# Patient Record
Sex: Female | Born: 1955 | Race: White | Hispanic: No | Smoking: Never smoker
Health system: Southern US, Community
[De-identification: ages and names within clinical notes are randomized; demographics above are authoritative.]

## PROBLEM LIST (undated history)

## (undated) DIAGNOSIS — E7800 Pure hypercholesterolemia, unspecified: Secondary | ICD-10-CM

## (undated) DIAGNOSIS — E041 Nontoxic single thyroid nodule: Secondary | ICD-10-CM

## (undated) DIAGNOSIS — E559 Vitamin D deficiency, unspecified: Secondary | ICD-10-CM

## (undated) DIAGNOSIS — R300 Dysuria: Secondary | ICD-10-CM

## (undated) DIAGNOSIS — R5383 Other fatigue: Secondary | ICD-10-CM

## (undated) HISTORY — DX: Dysuria: R30.0

## (undated) HISTORY — DX: Nontoxic single thyroid nodule: E04.1

## (undated) HISTORY — PX: REPLACEMENT TOTAL KNEE: SUR1224

## (undated) HISTORY — DX: Pure hypercholesterolemia, unspecified: E78.00

## (undated) HISTORY — PX: MEDIAL PARTIAL KNEE REPLACEMENT: SHX5965

## (undated) HISTORY — DX: Vitamin D deficiency, unspecified: E55.9

## (undated) HISTORY — DX: Other fatigue: R53.83

---

## 2012-04-09 DIAGNOSIS — F32A Depression, unspecified: Secondary | ICD-10-CM | POA: Insufficient documentation

## 2012-12-11 DIAGNOSIS — R0789 Other chest pain: Secondary | ICD-10-CM | POA: Insufficient documentation

## 2012-12-11 DIAGNOSIS — R0602 Shortness of breath: Secondary | ICD-10-CM | POA: Insufficient documentation

## 2012-12-27 DIAGNOSIS — M179 Osteoarthritis of knee, unspecified: Secondary | ICD-10-CM | POA: Insufficient documentation

## 2013-03-13 DIAGNOSIS — Z96659 Presence of unspecified artificial knee joint: Secondary | ICD-10-CM | POA: Insufficient documentation

## 2013-03-26 DIAGNOSIS — R29898 Other symptoms and signs involving the musculoskeletal system: Secondary | ICD-10-CM | POA: Insufficient documentation

## 2013-03-26 DIAGNOSIS — M25669 Stiffness of unspecified knee, not elsewhere classified: Secondary | ICD-10-CM | POA: Insufficient documentation

## 2013-03-26 DIAGNOSIS — R269 Unspecified abnormalities of gait and mobility: Secondary | ICD-10-CM | POA: Insufficient documentation

## 2013-04-03 DIAGNOSIS — M25561 Pain in right knee: Secondary | ICD-10-CM | POA: Insufficient documentation

## 2013-06-06 DIAGNOSIS — M1710 Unilateral primary osteoarthritis, unspecified knee: Secondary | ICD-10-CM | POA: Insufficient documentation

## 2020-03-07 LAB — TSH: TSH: 0.51 (ref 0.41–5.90)

## 2020-03-07 LAB — BASIC METABOLIC PANEL
BUN: 17 (ref 4–21)
Creatinine: 0.8 (ref 0.5–1.1)

## 2020-03-07 LAB — COMPREHENSIVE METABOLIC PANEL
GFR calc Af Amer: 93
GFR calc non Af Amer: 76

## 2020-03-07 LAB — HEMOGLOBIN A1C: Hemoglobin A1C: 5.5

## 2020-03-07 LAB — VITAMIN D 25 HYDROXY (VIT D DEFICIENCY, FRACTURES): Vit D, 25-Hydroxy: 50.66

## 2020-05-05 ENCOUNTER — Other Ambulatory Visit: Payer: Self-pay

## 2020-05-05 ENCOUNTER — Encounter: Payer: Self-pay | Admitting: "Endocrinology

## 2020-05-05 ENCOUNTER — Ambulatory Visit (INDEPENDENT_AMBULATORY_CARE_PROVIDER_SITE_OTHER): Payer: Medicare Other | Admitting: "Endocrinology

## 2020-05-05 VITALS — BP 140/78 | HR 72 | Ht 68.0 in | Wt 226.0 lb

## 2020-05-05 DIAGNOSIS — E042 Nontoxic multinodular goiter: Secondary | ICD-10-CM | POA: Insufficient documentation

## 2020-05-05 NOTE — Progress Notes (Signed)
Endocrinology Consult Note                                            05/05/2020, 12:27 PM   Subjective:    Patient ID: Bianca Washington, female    DOB: 27-Jan-1956, PCP Mayo, Baxter Kail, PA-C   Past Medical History:  Diagnosis Date  . Dysuria   . Fatigue   . Hypercholesterolemia   . Thyroid nodule   . Vitamin D deficiency    Past Surgical History:  Procedure Laterality Date  . CESAREAN SECTION    . MEDIAL PARTIAL KNEE REPLACEMENT    . REPLACEMENT TOTAL KNEE     Social History   Socioeconomic History  . Marital status: Unknown    Spouse name: Not on file  . Number of children: Not on file  . Years of education: Not on file  . Highest education level: Not on file  Occupational History  . Not on file  Tobacco Use  . Smoking status: Never Smoker  Vaping Use  . Vaping Use: Never used  Substance and Sexual Activity  . Alcohol use: Never  . Drug use: Never  . Sexual activity: Not on file  Other Topics Concern  . Not on file  Social History Narrative  . Not on file   Social Determinants of Health   Financial Resource Strain:   . Difficulty of Paying Living Expenses: Not on file  Food Insecurity:   . Worried About Programme researcher, broadcasting/film/video in the Last Year: Not on file  . Ran Out of Food in the Last Year: Not on file  Transportation Needs:   . Lack of Transportation (Medical): Not on file  . Lack of Transportation (Non-Medical): Not on file  Physical Activity:   . Days of Exercise per Week: Not on file  . Minutes of Exercise per Session: Not on file  Stress:   . Feeling of Stress : Not on file  Social Connections:   . Frequency of Communication with Friends and Family: Not on file  . Frequency of Social Gatherings with Friends and Family: Not on file  . Attends Religious Services: Not on file  . Active Member of Clubs or Organizations: Not on file  . Attends Banker Meetings: Not on file  . Marital Status: Not on file   Family History   Problem Relation Age of Onset  . Thyroid disease Mother   . Heart block Mother   . Kidney disease Father    No outpatient encounter medications on file as of 05/05/2020.   No facility-administered encounter medications on file as of 05/05/2020.   ALLERGIES: No Known Allergies  VACCINATION STATUS:  There is no immunization history on file for this patient.  HPI Bianca Washington is 64 y.o. female who presents today with a medical history as above. she is being seen in consultation for MNG requested by Mayo, Baxter Kail, PA-C.   History is obtained directly from the patient and review of her referral package.  She denies any prior history of thyroid dysfunction.  She was however found to have multinodular goiter based on ultrasound performed in September 2021-summarized below.  Patient has been taking thyroid hormone supplement from vitamin shop for at least the last 67-month.  In August 2021, her TSH was suppressed.  She describes multiple symptoms including skin rash,  Progressive weight gain, hair loss, fatigue, and general malaise. She gives family history of thyroid dysfunction in one of her siblings.  She denies any prior history of thyroid surgery nor radioactive iodine thyroid ablation. She denies any exposure to neck radiation.  She has fluctuating body weight, more recently progressive weight gain.  Review of Systems  Constitutional: + Progressive weight gain, + fatigue, no subjective hyperthermia, no subjective hypothermia Eyes: no blurry vision, no xerophthalmia ENT: + goiter, no odynophagia, no hoarseness Cardiovascular: no Chest Pain, no Shortness of Breath, no palpitations, no leg swelling Respiratory: no cough, no shortness of breath Gastrointestinal: no Nausea/Vomiting/Diarhhea Musculoskeletal: no muscle/joint aches Skin: + rashes, + hair loss Neurological: no tremors, no numbness, no tingling, no dizziness Psychiatric: no depression, no anxiety  Objective:     Vitals with BMI 05/05/2020  Height 5\' 8"   Weight 226 lbs  BMI 34.37  Systolic 140  Diastolic 78  Pulse 72    BP 140/78   Pulse 72   Ht 5\' 8"  (1.727 m)   Wt 226 lb (102.5 kg)   BMI 34.36 kg/m   Wt Readings from Last 3 Encounters:  05/05/20 226 lb (102.5 kg)    Physical Exam  Constitutional:  Body mass index is 34.36 kg/m.,  not in acute distress, normal state of mind Eyes: PERRLA, EOMI, no exophthalmos ENT: moist mucous membranes, +gross thyromegaly, no gross cervical lymphadenopathy Cardiovascular: normal precordial activity, Regular Rate and Rhythm, no Murmur/Rubs/Gallops Respiratory:  adequate breathing efforts, no gross chest deformity, Clear to auscultation bilaterally Gastrointestinal: abdomen soft, Non -tender, Musculoskeletal: no gross deformities, strength intact in all four extremities Skin: moist, warm, no rashes Neurological: no tremor with outstretched hands, Deep tendon reflexes normal in bilateral lower extremities.  CMP ( most recent) CMP     Component Value Date/Time   BUN 17 03/07/2020 0000   CREATININE 0.8 03/07/2020 0000   GFRNONAA 76 03/07/2020 0000   GFRAA 93 03/07/2020 0000     Diabetic Labs (most recent): Lab Results  Component Value Date   HGBA1C 5.5 03/07/2020     Lipid Panel ( most recent) Lipid Panel  No results found for: CHOL, TRIG, HDL, CHOLHDL, VLDL, LDLCALC, LDLDIRECT, LABVLDL    Lab Results  Component Value Date   TSH 0.51 03/07/2020    March 19, 2020 thyroid ultrasound: Right lobe measures 8 x 3.5 x 5.6 cm with 1.4 cm nodule on the lower pole described as hypoechoic. Left lobe measures 4.3 x 1.8 x 1.8 cm with 1.2 cm isoechoic left lower pole nodule.       Assessment & Plan:   1. Multinodular goiter  - Bianca Washington  is being seen at a kind request of Mayo, March 21, 2020, PA-C. - I have reviewed her available thyroid records and clinically evaluated the patient. 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  She will need further work-up to characterize her thyroid nodules with thyroid uptake and scan.  For that to happen, she needs to stop thyroid hormone supplements.  She hesitantly accepts.  The vitamin shop thyroid supplements for at least 6 weeks.  After that, she will have thyroid uptake and scan, repeat full profile thyroid function test and will return for office visit. Her next thyroid function test will include antithyroid antibodies.  If she is identified to have cold nodules, she will be considered for fine-needle aspiration biopsy. - I did not initiate any  new prescriptions today. - she is advised to maintain close follow up with Mayo, Baxter Kail, PA-C for primary care needs.   - Time spent with the patient: 45 minutes, of which >50% was spent in  counseling her about her multinodular goiter, surreptitious thyrotoxicosis and the rest in obtaining information about her symptoms, reviewing her previous labs/studies ( including abstractions from other facilities),  evaluations, and treatments,  and developing a plan to confirm diagnosis and long term treatment based on the latest standards of care/guidelines; and documenting her care.  Bianca Washington participated in the discussions, expressed understanding, and voiced agreement with the above plans.  All questions were answered to her satisfaction. she is encouraged to contact clinic should she have any questions or concerns prior to her return visit.  Follow up plan: Return in about 7 weeks (around 06/23/2020), or do her uptake and scan 6 weeks from now, for F/U with Thyroid Uptake and Scan, F/U with Pre-visit Labs.   Marquis Lunch, MD [facility] [address] [place], [place] [address] Phone: [phone]  Fax: [phone]     05/05/2020, 12:27 PM  This note was  partially dictated with voice recognition software. Similar sounding words can be transcribed inadequately or may not  be corrected upon review.

## 2020-06-10 LAB — TSH: TSH: 0.048 u[IU]/mL — ABNORMAL LOW (ref 0.450–4.500)

## 2020-06-10 LAB — T4, FREE: Free T4: 2.61 ng/dL — ABNORMAL HIGH (ref 0.82–1.77)

## 2020-06-10 LAB — THYROGLOBULIN ANTIBODY: Thyroglobulin Antibody: <1 IU/mL (ref 0.0–0.9)

## 2020-06-10 LAB — THYROID PEROXIDASE ANTIBODY: Thyroperoxidase Ab SerPl-aCnc: <8 IU/mL (ref 0–34)

## 2020-06-10 LAB — T3, FREE: T3, Free: 5.3 pg/mL — ABNORMAL HIGH (ref 2.0–4.4)

## 2020-06-15 ENCOUNTER — Encounter (HOSPITAL_COMMUNITY)
Admission: RE | Admit: 2020-06-15 | Discharge: 2020-06-15 | Disposition: A | Discharge status: Home / Self Care | Payer: Medicare Other | Source: Ambulatory Visit | Attending: "Endocrinology | Admitting: "Endocrinology

## 2020-06-15 ENCOUNTER — Other Ambulatory Visit: Payer: Self-pay

## 2020-06-15 DIAGNOSIS — E042 Nontoxic multinodular goiter: Secondary | ICD-10-CM | POA: Insufficient documentation

## 2020-06-15 MED ORDER — SODIUM IODIDE I-123 7.4 MBQ CAPS
420.0000 | ORAL_CAPSULE | Freq: Once | ORAL | Status: AC
  Administered 2020-06-15: 420 via ORAL

## 2020-06-16 ENCOUNTER — Encounter (HOSPITAL_COMMUNITY)
Admission: RE | Admit: 2020-06-16 | Discharge: 2020-06-16 | Disposition: A | Discharge status: Home / Self Care | Payer: Medicare Other | Source: Ambulatory Visit | Attending: "Endocrinology | Admitting: "Endocrinology

## 2020-06-23 ENCOUNTER — Ambulatory Visit (INDEPENDENT_AMBULATORY_CARE_PROVIDER_SITE_OTHER): Payer: Medicare Other | Admitting: "Endocrinology

## 2020-06-23 ENCOUNTER — Encounter: Payer: Self-pay | Admitting: "Endocrinology

## 2020-06-23 ENCOUNTER — Other Ambulatory Visit: Payer: Self-pay

## 2020-06-23 VITALS — BP 138/82 | HR 84 | Ht 68.0 in | Wt 224.0 lb

## 2020-06-23 DIAGNOSIS — E0540 Thyrotoxicosis factitia without thyrotoxic crisis or storm: Secondary | ICD-10-CM | POA: Insufficient documentation

## 2020-06-23 DIAGNOSIS — E042 Nontoxic multinodular goiter: Secondary | ICD-10-CM | POA: Diagnosis not present

## 2020-06-23 NOTE — Progress Notes (Signed)
06/23/2020, 9:40 AM  Endocrinology follow-up note   Subjective:    Patient ID: Bianca Washington, female    DOB: [birth year], PCP Mayo, Baxter Kail, PA-C   Past Medical History:  Diagnosis Date  . Dysuria   . Fatigue   . Hypercholesterolemia   . Thyroid nodule   . Vitamin D deficiency    Past Surgical History:  Procedure Laterality Date  . CESAREAN SECTION    . MEDIAL PARTIAL KNEE REPLACEMENT    . REPLACEMENT TOTAL KNEE     Social History   Socioeconomic History  . Marital status: Unknown    Spouse name: Not on file  . Number of children: Not on file  . Years of education: Not on file  . Highest education level: Not on file  Occupational History  . Not on file  Tobacco Use  . Smoking status: Never Smoker  Vaping Use  . Vaping Use: Never used  Substance and Sexual Activity  . Alcohol use: Never  . Drug use: Never  . Sexual activity: Not on file  Other Topics Concern  . Not on file  Social History Narrative  . Not on file   Social Determinants of Health   Financial Resource Strain:   . Difficulty of Paying Living Expenses: Not on file  Food Insecurity:   . Worried About Programme researcher, broadcasting/film/video in the Last Year: Not on file  . Ran Out of Food in the Last Year: Not on file  Transportation Needs:   . Lack of Transportation (Medical): Not on file  . Lack of Transportation (Non-Medical): Not on file  Physical Activity:   . Days of Exercise per Week: Not on file  . Minutes of Exercise per Session: Not on file  Stress:   . Feeling of Stress : Not on file  Social Connections:   . Frequency of Communication with Friends and Family: Not on file  . Frequency of Social Gatherings with Friends and Family: Not on file  . Attends Religious Services: Not on file  . Active Member of Clubs or Organizations: Not on file  . Attends Banker Meetings: Not on file  . Marital Status: Not on file   Family History   Problem Relation Age of Onset  . Thyroid disease Mother   . Heart block Mother   . Kidney disease Father    No outpatient encounter medications on file as of 06/23/2020.   No facility-administered encounter medications on file as of 06/23/2020.   ALLERGIES: No Known Allergies  VACCINATION STATUS:  There is no immunization history on file for this patient.  HPI Bianca Washington is 64 y.o. female who is returning today to follow-up after she was seen in consultation for multinodular goiter during her last visit requested by  Bianca Boys, PA-C.    See notes from her prior visits. History is obtained directly from the patient and review of her referral package.  She denies any prior history of thyroid dysfunction.  She was however found to have multinodular goiter based on ultrasound performed in September 2021-summarized below.  Patient has been taking thyroid hormone supplement from vitamin shop for at least the last 9-month.  In August 2021, her  TSH was suppressed.  She describes multiple symptoms including skin rash,  Progressive weight gain, hair loss, fatigue, and general malaise.  She was advised to stop all thyroid hormone supplements for baseline thyroid function tests.  She reports she stopped those supplements, however her lab work still shows surreptitious thyrotoxicosis.  Her thyroid uptake and scan is negative for primary hyperthyroidism.  See her lab profile below. She gives family history of thyroid dysfunction in one of her siblings.  She denies any prior history of thyroid surgery nor radioactive iodine thyroid ablation. She denies any exposure to neck radiation.  She has fluctuating body weight, more recently progressive weight loss.  Review of Systems  Constitutional: + Fluctuating body weight, + fatigue, no subjective hyperthermia, no subjective hypothermia Eyes: no blurry vision, no xerophthalmia ENT: + goiter, no odynophagia, no hoarseness Cardiovascular: no  Chest Pain, no Shortness of Breath, no palpitations, no leg swelling Respiratory: no cough, no shortness of breath Gastrointestinal: no Nausea/Vomiting/Diarhhea Musculoskeletal: no muscle/joint aches Skin: + rashes, + hair loss Neurological: no tremors, no numbness, no tingling, no dizziness Psychiatric: no depression, no anxiety  Objective:    Vitals with BMI 06/23/2020 05/05/2020  Height 5\' 8"  5\' 8"   Weight 224 lbs 226 lbs  BMI 34.07 34.37  Systolic 138 140  Diastolic 82 78  Pulse 84 72    BP 138/82   Pulse 84   Ht 5\' 8"  (1.727 m)   Wt 224 lb (101.6 kg)   BMI 34.06 kg/m   Wt Readings from Last 3 Encounters:  06/23/20 224 lb (101.6 kg)  05/05/20 226 lb (102.5 kg)    Physical Exam  Constitutional:  Body mass index is 34.06 kg/m.,  not in acute distress, normal state of mind Eyes: PERRLA, EOMI, no exophthalmos ENT: moist mucous membranes, +gross thyromegaly, no gross cervical lymphadenopathy  Musculoskeletal: no gross deformities, strength intact in all four extremities Skin: moist, warm, no rashes Neurological: no tremor with outstretched hands, Deep tendon reflexes normal in bilateral lower extremities.  CMP ( most recent) CMP     Component Value Date/Time   BUN 17 03/07/2020 0000   CREATININE 0.8 03/07/2020 0000   GFRNONAA 76 03/07/2020 0000   GFRAA 93 03/07/2020 0000     Diabetic Labs (most recent): Lab Results  Component Value Date   HGBA1C 5.5 03/07/2020     Lipid Panel ( most recent) Lipid Panel  No results found for: CHOL, TRIG, HDL, CHOLHDL, VLDL, LDLCALC, LDLDIRECT, LABVLDL    Lab Results  Component Value Date   TSH 0.048 (L) 06/09/2020   TSH 0.51 03/07/2020   FREET4 2.61 (H) 06/09/2020    March 19, 2020 thyroid ultrasound: Right lobe measures 8 x 3.5 x 5.6 cm with 1.4 cm nodule on the lower pole described as hypoechoic. Left lobe measures 4.3 x 1.8 x 1.8 cm with 1.2 cm isoechoic left lower pole nodule.    Recent Results (from the  past 2160 hour(s))  TSH     Status: Abnormal   Collection Time: 06/09/20  8:30 AM  Result Value Ref Range   TSH 0.048 (L) 0.450 - 4.500 uIU/mL  T4, free     Status: Abnormal   Collection Time: 06/09/20  8:30 AM  Result Value Ref Range   Free T4 2.61 (H) 0.82 - 1.77 ng/dL  T3, free     Status: Abnormal   Collection Time: 06/09/20  8:30 AM  Result Value Ref Range   T3, Free 5.3 (H) 2.0 - 4.4  pg/mL  Thyroid peroxidase antibody     Status: None   Collection Time: 06/09/20  8:30 AM  Result Value Ref Range   Thyroperoxidase Ab SerPl-aCnc <8 0 - 34 IU/mL  Thyroglobulin antibody     Status: None   Collection Time: 06/09/20  8:30 AM  Result Value Ref Range   Thyroglobulin Antibody <1.0 0.0 - 0.9 IU/mL    Comment: Thyroglobulin Antibody measured by [employer] Methodology    Thyroid uptake and scan on June 16, 2020 FINDINGS: Insufficient uptake of tracer within thyroid tissue for assessment of morphology.  4 hour I-123 uptake = 1.0% (normal 5-20%),  24 hour I-123 uptake = 0.8% (normal 10-30%)  IMPRESSION: Markedly decreased 4 hour and 24 hour radio iodine uptakes. Insufficient uptake of tracer to assess thyroid morphology.  Assessment & Plan:   1. Multinodular goiter 2.  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  Patient still takes multiple vitamins/natural supplements.  She is approached again to stop all antithyroid or pro-thyroid supplements for 4 weeks to  repeat thyroid function test and return for follow-up in 5 weeks.  Her thyroid uptake and scan is not confirming primary hyperthyroidism.  It is more indicative of surreptitious thyrotoxicosis.  In light of her previously documented multinodular goiter, she will need a follow-up ultrasound in March 2022.  - I did not initiate any new prescriptions today. - she is advised to maintain close follow up with Mayo, Baxter Kail, PA-C for primary care needs.     - Time spent on this patient care encounter:  20 minutes of which 50% was spent in  counseling and the rest reviewing  her current and  previous labs / studies and medications  doses and developing a plan for long term care. Bianca Washington  participated in the discussions, expressed understanding, and voiced agreement with the above plans.  All questions were answered to her satisfaction. she is encouraged to contact clinic should she have any questions or concerns prior to her return visit.   Follow up plan: Return in about 5 weeks (around 07/28/2020) for F/U with Pre-visit Labs.   Marquis Lunch, MD [facility] [address] [place], [place] [address] Phone: [phone]  Fax: [phone]     06/23/2020, 9:40 AM  This note was partially dictated with voice recognition software. Similar sounding words can be transcribed inadequately or may not  be corrected upon review.

## 2020-07-23 LAB — TSH: TSH: 0.023 u[IU]/mL — ABNORMAL LOW (ref 0.450–4.500)

## 2020-07-23 LAB — T4, FREE: Free T4: 2.37 ng/dL — ABNORMAL HIGH (ref 0.82–1.77)

## 2020-07-23 LAB — T3, FREE: T3, Free: 4.9 pg/mL — ABNORMAL HIGH (ref 2.0–4.4)

## 2020-07-28 ENCOUNTER — Ambulatory Visit: Payer: Medicare Other | Admitting: "Endocrinology

## 2020-07-29 ENCOUNTER — Other Ambulatory Visit: Payer: Self-pay

## 2020-07-29 ENCOUNTER — Ambulatory Visit (INDEPENDENT_AMBULATORY_CARE_PROVIDER_SITE_OTHER): Payer: Medicare Other | Admitting: "Endocrinology

## 2020-07-29 ENCOUNTER — Encounter: Payer: Self-pay | Admitting: "Endocrinology

## 2020-07-29 VITALS — BP 128/80 | HR 80 | Ht 68.0 in | Wt 222.4 lb

## 2020-07-29 DIAGNOSIS — E0540 Thyrotoxicosis factitia without thyrotoxic crisis or storm: Secondary | ICD-10-CM

## 2020-07-29 MED ORDER — ATENOLOL 25 MG PO TABS: 25.0000 mg | ORAL_TABLET | Freq: Every day | ORAL | 1 refills | Status: DC

## 2020-07-29 NOTE — Progress Notes (Signed)
07/29/2020, 7:52 PM  Endocrinology follow-up note   Subjective:    Patient ID: Bianca Washington, female    DOB: [birth year], PCP [facility], Baxter Kail, PA-C   Past Medical History:  Diagnosis Date  . Dysuria   . Fatigue   . Hypercholesterolemia   . Thyroid nodule   . Vitamin D deficiency    Past Surgical History:  Procedure Laterality Date  . CESAREAN SECTION    . MEDIAL PARTIAL KNEE REPLACEMENT    . REPLACEMENT TOTAL KNEE     Social History   Socioeconomic History  . Marital status: Unknown    Spouse name: Not on file  . Number of children: Not on file  . Years of education: Not on file  . Highest education level: Not on file  Occupational History  . Not on file  Tobacco Use  . Smoking status: Never Smoker  . Smokeless tobacco: Not on file  Vaping Use  . Vaping Use: Never used  Substance and Sexual Activity  . Alcohol use: Never  . Drug use: Never  . Sexual activity: Not on file  Other Topics Concern  . Not on file  Social History Narrative  . Not on file   Social Determinants of Health   Financial Resource Strain: Not on file  Food Insecurity: Not on file  Transportation Needs: Not on file  Physical Activity: Not on file  Stress: Not on file  Social Connections: Not on file   Family History  Problem Relation Age of Onset  . Thyroid disease Mother   . Heart block Mother   . Kidney disease Father    Outpatient Encounter Medications as of 07/29/2020  Medication Sig  . atenolol (TENORMIN) 25 MG tablet Take 1 tablet (25 mg total) by mouth daily.   No facility-administered encounter medications on file as of 07/29/2020.   ALLERGIES: No Known Allergies  VACCINATION STATUS:  There is no immunization history on file for this patient.  HPI Bianca Washington is 65 y.o. female who is returning today to follow-up after she was seen in consultation for multinodular goiter during her last visit requested by   Kirt Boys, PA-C.    See notes from her prior visits. History is obtained directly from the patient and review of her referral package.  She denies any prior history of thyroid dysfunction.  She was however found to have multinodular goiter based on ultrasound performed in September 2021-summarized below.  Admittedly, patient has been taking thyroid hormone supplement from [facility] for at least the last 3-month.  In August 2021, her TSH was suppressed.  Her thyroid uptake and scan was low at 0.8% ruling out primary hypothyroidism.  With a diagnosis of surreptitious thyrotoxicosis, she was advised to discontinue all thyroid supplements which she says she did.  Her previsit labs still show significant hyperthyroxinemia, patient presents with palpitations intermittently.  She still complains of hair loss.   She gives family history of thyroid dysfunction in one of her siblings.  She denies any prior history of thyroid surgery nor radioactive iodine thyroid ablation. She denies any exposure to neck radiation.  She has fluctuating body weight, more recently progressive weight loss.  Review of Systems  Constitutional: +  Fluctuating body weight, + fatigue, no subjective hyperthermia, no subjective hypothermia Eyes: no blurry vision, no xerophthalmia ENT: + goiter, no odynophagia, no hoarseness Cardiovascular: no Chest Pain, no Shortness of Breath, no palpitations, no leg swelling Respiratory: no cough, no shortness of breath Gastrointestinal: no Nausea/Vomiting/Diarhhea Musculoskeletal: no muscle/joint aches Skin: + rashes, + hair loss Neurological: no tremors, no numbness, no tingling, no dizziness Psychiatric: no depression, no anxiety  Objective:    Vitals with BMI 07/29/2020 06/23/2020 05/05/2020  Height 5\' 8"  5\' 8"  5\' 8"   Weight 222 lbs 6 oz 224 lbs 226 lbs  BMI 33.82 34.07 34.37  Systolic 128 138  Diastolic 80 82 78  Pulse 80 84 72    BP 128/80   Pulse 80   Ht 5'  8" (1.727 m)   Wt 222 lb 6.4 oz (100.9 kg)   BMI 33.82 kg/m   Wt Readings from Last 3 Encounters:  07/29/20 222 lb 6.4 oz (100.9 kg)  06/23/20 224 lb (101.6 kg)  05/05/20 226 lb (102.5 kg)    Physical Exam  Constitutional:  Body mass index is 33.82 kg/m.,  not in acute distress, normal state of mind Eyes: PERRLA, EOMI, no exophthalmos ENT: moist mucous membranes, +gross thyromegaly, no gross cervical lymphadenopathy  Musculoskeletal: no gross deformities, strength intact in all four extremities Skin: moist, warm, no rashes Neurological: no tremor with outstretched hands, Deep tendon reflexes normal in bilateral lower extremities.  CMP ( most recent) CMP     Component Value Date/Time   BUN 17 03/07/2020 0000   CREATININE 0.8 03/07/2020 0000   GFRNONAA 76 03/07/2020 0000   GFRAA 93 03/07/2020 0000     Diabetic Labs (most recent): Lab Results  Component Value Date   HGBA1C 5.5 03/07/2020     Lipid Panel ( most recent) Lipid Panel  No results found for: CHOL, TRIG, HDL, CHOLHDL, VLDL, LDLCALC, LDLDIRECT, LABVLDL    Lab Results  Component Value Date   TSH 0.023 (L) 07/22/2020   TSH 0.048 (L) 06/09/2020   TSH 0.51 03/07/2020   FREET4 2.37 (H) 07/22/2020   FREET4 2.61 (H) 06/09/2020    March 19, 2020 thyroid ultrasound: Right lobe measures 8 x 3.5 x 5.6 cm with 1.4 cm nodule on the lower pole described as hypoechoic. Left lobe measures 4.3 x 1.8 x 1.8 cm with 1.2 cm isoechoic left lower pole nodule.    Recent Results (from the past 2160 hour(s))  TSH     Status: Abnormal   Collection Time: 06/09/20  8:30 AM  Result Value Ref Range   TSH 0.048 (L) 0.450 - 4.500 uIU/mL  T4, free     Status: Abnormal   Collection Time: 06/09/20  8:30 AM  Result Value Ref Range   Free T4 2.61 (H) 0.82 - 1.77 ng/dL  T3, free     Status: Abnormal   Collection Time: 06/09/20  8:30 AM  Result Value Ref Range   T3, Free 5.3 (H) 2.0 - 4.4 pg/mL  Thyroid peroxidase antibody      Status: None   Collection Time: 06/09/20  8:30 AM  Result Value Ref Range   Thyroperoxidase Ab SerPl-aCnc <8 0 - 34 IU/mL  Thyroglobulin antibody     Status: None   Collection Time: 06/09/20  8:30 AM  Result Value Ref Range   Thyroglobulin Antibody <1.0 0.0 - 0.9 IU/mL    Comment: Thyroglobulin Antibody measured by [employer] Coul[employer] Methodology  TSH     Status: Abnormal  Collection Time: 07/22/20  4:35 PM  Result Value Ref Range   TSH 0.023 (L) 0.450 - 4.500 uIU/mL  T4, free     Status: Abnormal   Collection Time: 07/22/20  4:35 PM  Result Value Ref Range   Free T4 2.37 (H) 0.82 - 1.77 ng/dL  T3, free     Status: Abnormal   Collection Time: 07/22/20  4:35 PM  Result Value Ref Range   T3, Free 4.9 (H) 2.0 - 4.4 pg/mL    Thyroid uptake and scan on June 16, 2020 FINDINGS: Insufficient uptake of tracer within thyroid tissue for assessment of morphology.  4 hour I-123 uptake = 1.0% (normal 5-20%),  24 hour I-123 uptake = 0.8% (normal 10-30%)  IMPRESSION: Markedly decreased 4 hour and 24 hour radio iodine uptakes. Insufficient uptake of tracer to assess thyroid morphology.  Assessment & Plan:   1. Multinodular goiter 2.  Thyrotoxicosis  factitia  Her previsit work-up shows only minimal decrease in the thyroid hormone burden by about 10%.  Patient insists that she has stopped all of the thyroid supplements that she used to get from the vitamin shops.  Her previous work-up for recurrent with surreptitious thyrotoxicosis, will not need antithyroid intervention at this time.  To help her with symptoms, I discussed and at that atenolol 25 mg p.o. daily.  She will have repeat thyroid function test in 9 weeks.  If she continues to have significant hypothyroidism, her uptake and scan will be repeated.  In light of her previously documented multinodular goiter, she will need a follow-up ultrasound in March 2022.   - she is advised to maintain close follow up with [facility], Baxter Kail, PA-C for primary care needs.     - Time spent on this patient care encounter:  20 minutes of which 50% was spent in  counseling and the rest reviewing  her current and  previous labs / studies and medications  doses and developing a plan for long term care. Bianca Washington  participated in the discussions, expressed understanding, and voiced agreement with the above plans.  All questions were answered to her satisfaction. she is encouraged to contact clinic should she have any questions or concerns prior to her return visit.   Follow up plan: Return in about 9 weeks (around 09/30/2020) for F/U with Pre-visit Labs, Thyroid / Neck Ultrasound.   Marquis Lunch, MD [facility] [address] [place], [place] [address] Phone: [phone]  Fax: [phone]     07/29/2020, 7:52 PM  This note was partially dictated with voice recognition software. Similar sounding words can be transcribed inadequately or may not  be corrected upon review.

## 2020-09-23 ENCOUNTER — Other Ambulatory Visit: Payer: Self-pay

## 2020-09-23 ENCOUNTER — Other Ambulatory Visit (HOSPITAL_COMMUNITY)
Admission: RE | Admit: 2020-09-23 | Discharge: 2020-09-23 | Disposition: A | Discharge status: Home / Self Care | Payer: Medicare Other | Source: Ambulatory Visit | Attending: "Endocrinology | Admitting: "Endocrinology

## 2020-09-23 ENCOUNTER — Ambulatory Visit (HOSPITAL_COMMUNITY)
Admission: RE | Admit: 2020-09-23 | Discharge: 2020-09-23 | Disposition: A | Discharge status: Home / Self Care | Payer: Medicare Other | Source: Ambulatory Visit | Attending: "Endocrinology | Admitting: "Endocrinology

## 2020-09-23 DIAGNOSIS — E042 Nontoxic multinodular goiter: Secondary | ICD-10-CM | POA: Insufficient documentation

## 2020-09-23 DIAGNOSIS — E0540 Thyrotoxicosis factitia without thyrotoxic crisis or storm: Secondary | ICD-10-CM | POA: Insufficient documentation

## 2020-09-23 LAB — T4, FREE: Free T4: 1.18 ng/dL — ABNORMAL HIGH (ref 0.61–1.12)

## 2020-09-23 LAB — TSH: TSH: 0.174 u[IU]/mL — ABNORMAL LOW (ref 0.350–4.500)

## 2020-09-24 ENCOUNTER — Other Ambulatory Visit: Payer: Self-pay | Admitting: "Endocrinology

## 2020-09-24 DIAGNOSIS — E042 Nontoxic multinodular goiter: Secondary | ICD-10-CM

## 2020-09-24 LAB — T3, FREE: T3, Free: 3.3 pg/mL (ref 2.0–4.4)

## 2020-09-30 ENCOUNTER — Ambulatory Visit: Payer: Medicare Other | Admitting: "Endocrinology

## 2020-10-07 ENCOUNTER — Ambulatory Visit (HOSPITAL_COMMUNITY)
Admission: RE | Admit: 2020-10-07 | Discharge: 2020-10-07 | Disposition: A | Discharge status: Home / Self Care | Payer: Medicare Other | Source: Ambulatory Visit | Attending: "Endocrinology | Admitting: "Endocrinology

## 2020-10-07 ENCOUNTER — Encounter (HOSPITAL_COMMUNITY): Payer: Self-pay

## 2020-10-07 ENCOUNTER — Other Ambulatory Visit: Payer: Self-pay | Admitting: "Endocrinology

## 2020-10-07 DIAGNOSIS — E042 Nontoxic multinodular goiter: Secondary | ICD-10-CM | POA: Diagnosis not present

## 2020-10-07 IMAGING — US US FNA BIOPSY THYROID 1ST LESION
1 series · 13 of 25 positions shown · non-contrast
Comparison: US Thyroid [DATE]

MEDICATIONS:
20 cc 1% lidocaine

COMPLICATIONS:
None immediate.

INDICATION: Indeterminate thyroid nodule
TECHNIQUE: Informed written consent was obtained from the patient after a
discussion of the risks, benefits and alternatives to treatment.
Questions regarding the procedure were encouraged and answered. A
timeout was performed prior to the initiation of the procedure.

[Series 1: us fna bx thyroid ea add lesion afirma · 30 acquisitions, 13 frames shown]
[im 1/30]
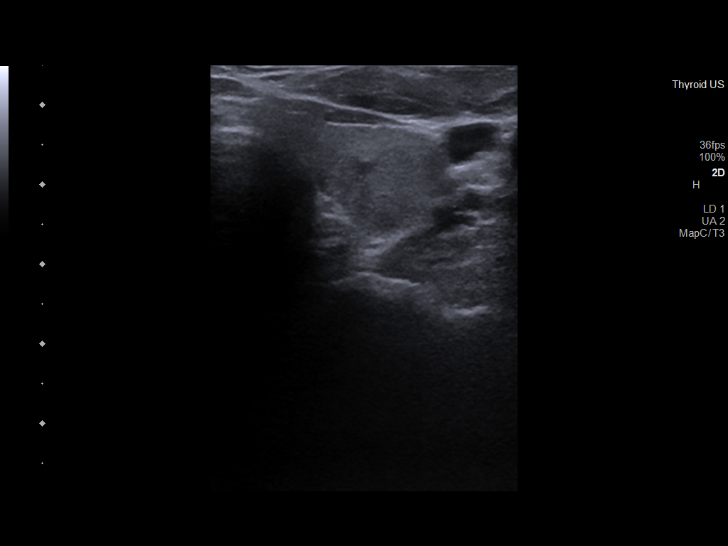
[im 3/30]
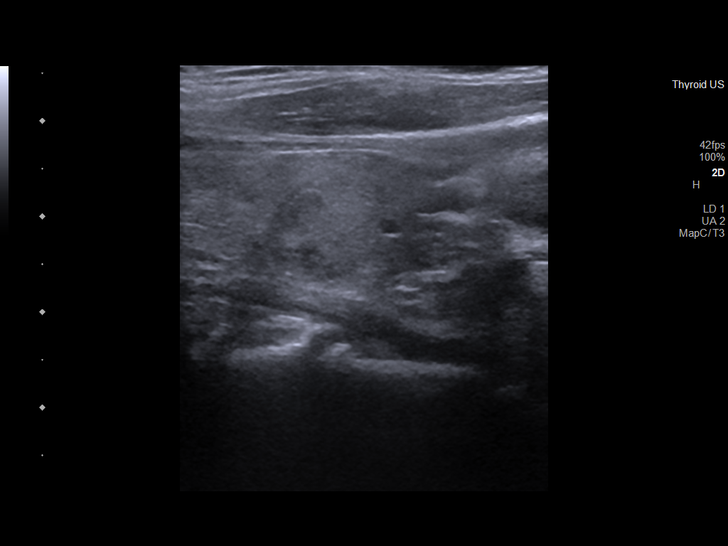
[im 5/30]
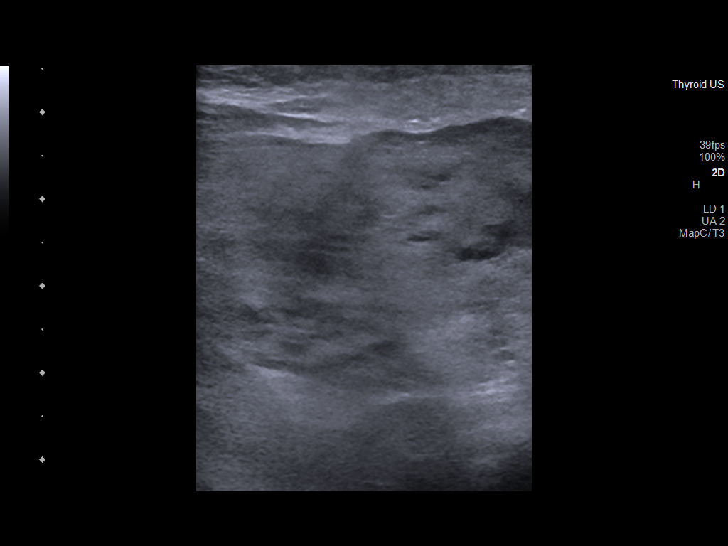
[im 8/30]
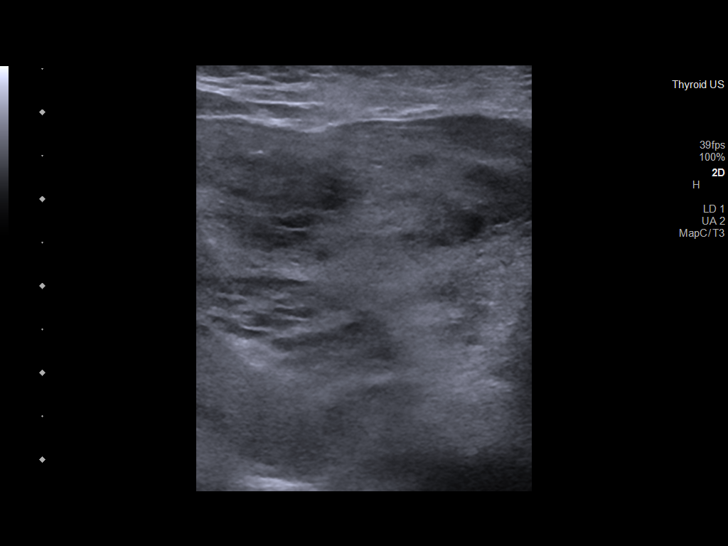
[im 10/30]
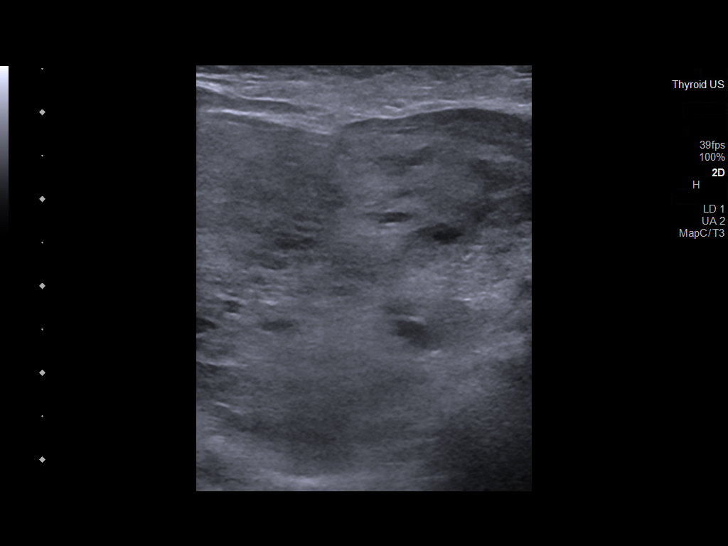
[im 13/30]
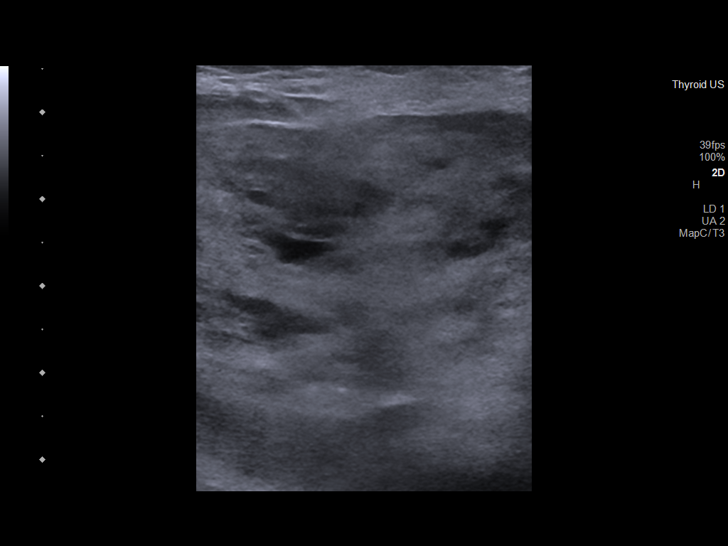
[im 15/30]
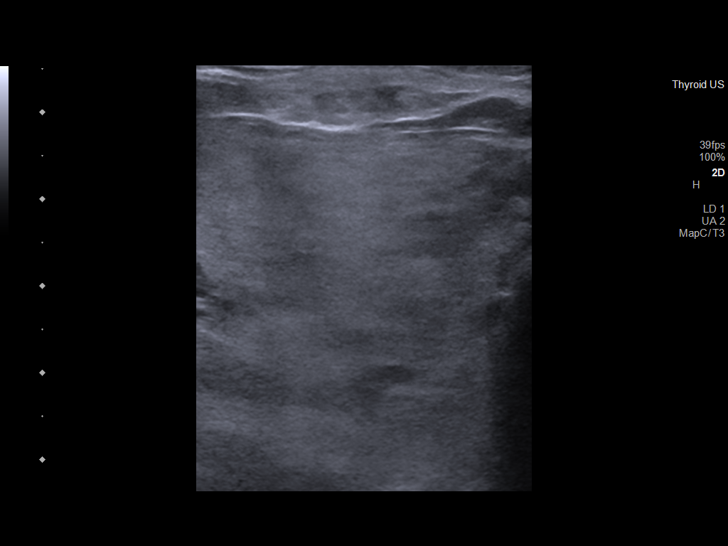
[im 17/30]
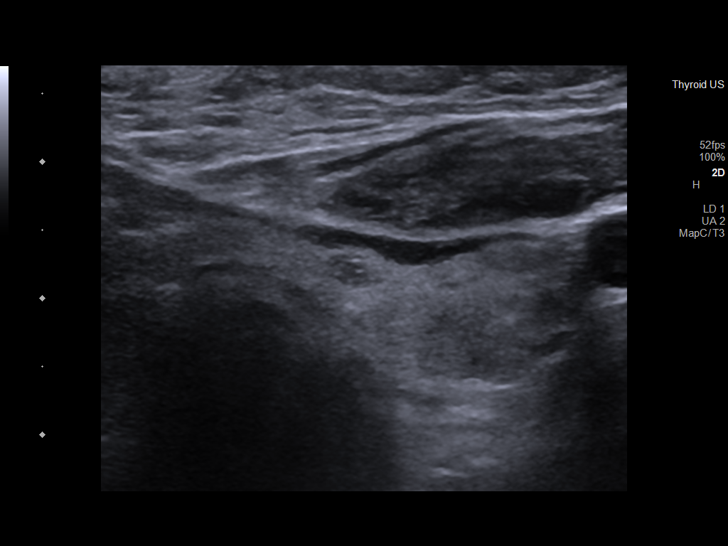
[im 20/30]
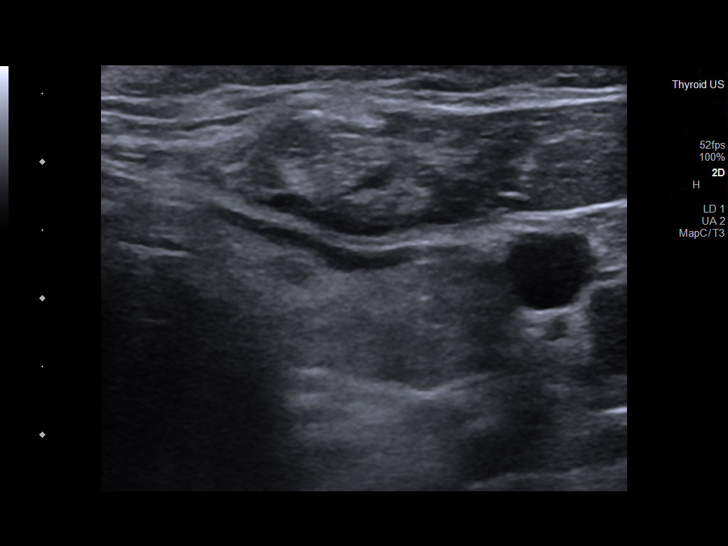
[im 22/30]
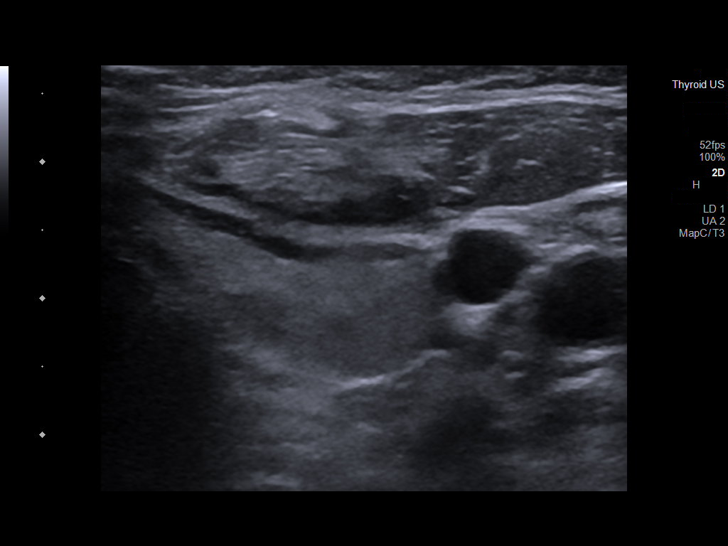
[im 25/30]
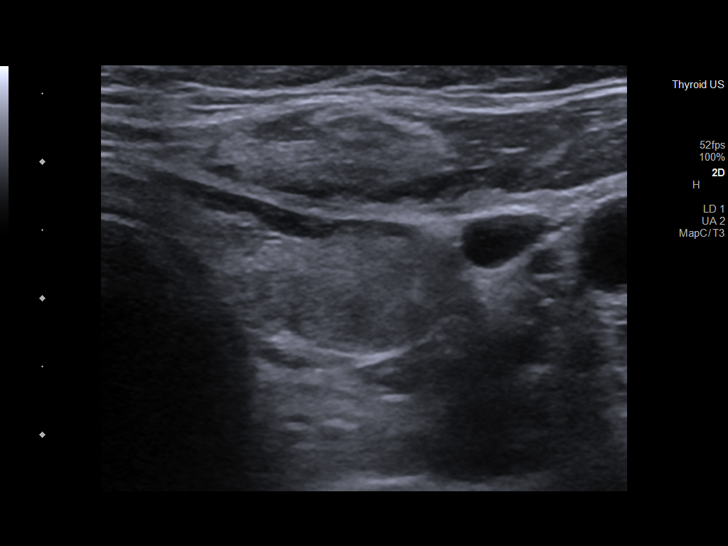
[im 27/30]
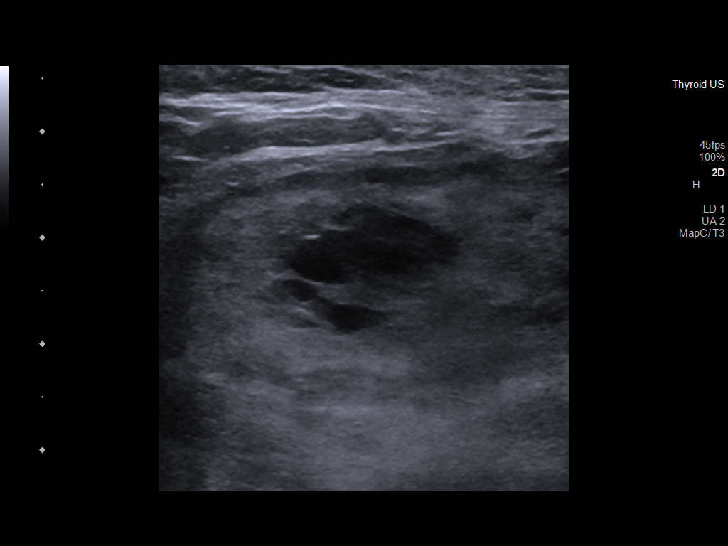
[im 30/30]
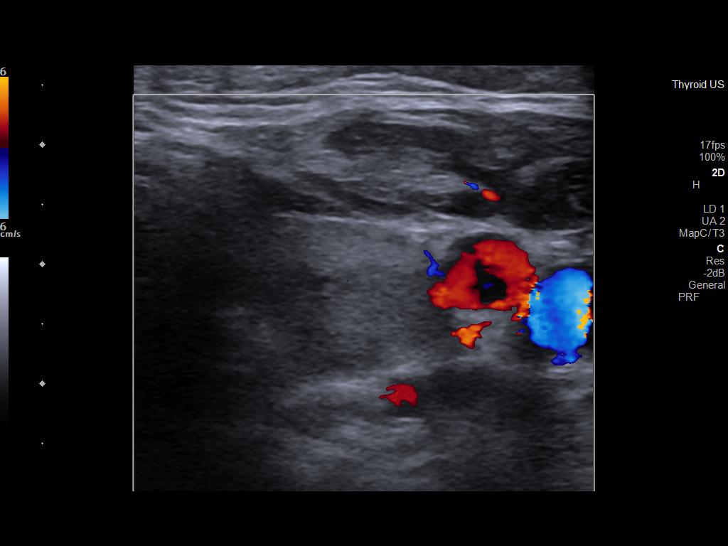

[13 of 25 positions shown; findings below may reference images not displayed]

Right inferior thyroid nodule; 8.9 cm

Left mid lobe thyroid nodule; 1.6 cm

Images all reviewed and discussed with Dr COLELLA prior to procedure

Decision made to perform biopsy of Rt inferior thyroid nodule #2
(appears #1 and #2 are actually one large nodule.)

And Biopsy of #4 Left mid lobe thyroid nodule

EXAM:
ULTRASOUND GUIDED FINE NEEDLE ASPIRATION OF INDETERMINATE THYROID
NODULE
Pre-procedural ultrasound scanning demonstrated unchanged size and
appearance of the indeterminate nodules within the right and left
thyroid

The procedure was planned. The neck was prepped in the usual sterile
fashion, and a sterile drape was applied covering the operative
field. A timeout was performed prior to the initiation of the
procedure. Local anesthesia was provided with 1% lidocaine.

Under direct ultrasound guidance, 5 FNA biopsies were performed of
the right inferior thyroid nodule with a 25 gauge needle.

2 of these sample were obtained for AFIRMA

Multiple ultrasound images were saved for procedural documentation
purposes. The samples were prepared and submitted to pathology.

Under direct ultrasound guidance, 5 FNA biopsies were performed of
the left mid lobe thyroid nodule with a 25 gauge needle.

2 of these samples were obtained for AFIRMA

Multiple ultrasound images were saved for procedural documentation
purposes. The samples were prepared and submitted to pathology.

Limited post procedural scanning was negative for hematoma or
additional complication. Dressings were placed. The patient
tolerated the above procedures procedure well without immediate
postprocedural complication.
FINDINGS: Nodule reference number based on prior diagnostic ultrasound: 2

Maximum size: 8.9 cm

Location: Right; Inferior

ACR TI-RADS risk category: TR4 (4-6 points)

Reason for biopsy: meets ACR TI-RADS criteria

_________________________________________________________

Nodule reference number based on prior diagnostic ultrasound: 4

Maximum size: 1.6 cm

Location: Left; Mid

ACR TI-RADS risk category: TR4 (4-6 points)

Reason for biopsy: meets ACR TI-RADS criteria

Ultrasound imaging confirms appropriate placement of the needles
within the thyroid nodule.
IMPRESSION: 1. Technically successful ultrasound guided fine needle aspiration
of right inferior thyroid nodule
2. Technically successful ultrasound guided fine needle aspiration
of left mid lobe thyroid nodule

Read by COLELLA

## 2020-10-07 MED ORDER — LIDOCAINE HCL (PF) 2 % IJ SOLN
INTRAMUSCULAR | Status: AC
  Administered 2020-10-07: 10 mL
  Filled 2020-10-07: qty 10

## 2020-10-07 MED ORDER — LIDOCAINE HCL (PF) 2 % IJ SOLN
10.0000 mL | Freq: Once | INTRAMUSCULAR | Status: AC
  Administered 2020-10-07: 10 mL

## 2020-10-07 MED ORDER — LIDOCAINE HCL (PF) 2 % IJ SOLN: 10.0000 mL | Freq: Once | INTRAMUSCULAR | Status: DC

## 2020-10-07 NOTE — Sedation Documentation (Signed)
PT tolerated thyroid biopsies procedure well today. Labs obtained and sent for pathology. PT ambulatory at discharge with no acute distress noted and verbalized understanding of discharge instructions.

## 2020-10-07 NOTE — Discharge Instructions (Signed)
Thyroid Needle Biopsy, Care After This sheet gives you information about how to care for yourself after your procedure. Your health care provider may also give you more specific instructions. If you have problems or questions, contact your health care provider. What can I expect after the procedure? After the procedure, it is common to have:  Soreness and tenderness that lasts for a few days.  Bruising where the needle was inserted (puncture site). Follow these instructions at home:  Take over-the-counter and prescription medicines only as told by your health care provider.  To help ease discomfort, keep your head raised (elevated) when you are lying down. When you move from lying down to sitting up, use both hands to support the back of your head and neck.  Check your puncture site every day for signs of infection. Check for: ? Redness, swelling, or pain. ? Fluid or blood. ? Warmth. ? Pus or a bad smell.  Return to your normal activities as told by your health care provider. Ask your health care provider what activities are safe for you.  Keep all follow-up visits as told by your health care provider. This is important.   Contact a health care provider if:  You have redness, swelling, or pain around your puncture site.  You have fluid or blood coming from your puncture site.  Your puncture site feels warm to the touch.  You have pus or a bad smell coming from your puncture site.  You have a fever. Get help right away if:  You have severe bleeding from the puncture site.  You have difficulty swallowing.  You have swollen glands (lymph nodes) in your neck. Summary  It is common to have some bruising and soreness where the needle was inserted in your lower front neck area (puncture site).  Check your puncture site every day for signs of infection, such as redness, swelling, or pain.  Get help right away if you have severe bleeding from your puncture site. This  information is not intended to replace advice given to you by your health care provider. Make sure you discuss any questions you have with your health care provider. Document Revised: 03/12/2020 Document Reviewed: 03/12/2020 Elsevier Patient Education  2021 [employer]  

## 2020-10-08 LAB — CYTOLOGY - NON PAP

## 2020-10-27 ENCOUNTER — Encounter (HOSPITAL_COMMUNITY): Payer: Self-pay

## 2020-10-28 ENCOUNTER — Other Ambulatory Visit: Payer: Self-pay

## 2020-10-28 ENCOUNTER — Encounter: Payer: Self-pay | Admitting: "Endocrinology

## 2020-10-28 ENCOUNTER — Ambulatory Visit (INDEPENDENT_AMBULATORY_CARE_PROVIDER_SITE_OTHER): Payer: Medicare Other | Admitting: "Endocrinology

## 2020-10-28 VITALS — BP 120/78 | HR 72 | Ht 68.0 in | Wt 227.4 lb

## 2020-10-28 DIAGNOSIS — E0540 Thyrotoxicosis factitia without thyrotoxic crisis or storm: Secondary | ICD-10-CM | POA: Diagnosis not present

## 2020-10-28 DIAGNOSIS — E042 Nontoxic multinodular goiter: Secondary | ICD-10-CM | POA: Diagnosis not present

## 2020-10-28 DIAGNOSIS — L659 Nonscarring hair loss, unspecified: Secondary | ICD-10-CM

## 2020-10-28 NOTE — Progress Notes (Signed)
10/28/2020, 5:40 PM  Endocrinology follow-up note   Subjective:    Patient ID: Bianca Washington, female    DOB: [birth year], PCP Mayo, Baxter Kailarmen Christina, PA-C   Past Medical History:  Diagnosis Date  . Dysuria   . Fatigue   . Hypercholesterolemia   . Thyroid nodule   . Vitamin D deficiency    Past Surgical History:  Procedure Laterality Date  . CESAREAN SECTION    . MEDIAL PARTIAL KNEE REPLACEMENT    . REPLACEMENT TOTAL KNEE     Social History   Socioeconomic History  . Marital status: Unknown    Spouse name: Not on file  . Number of children: Not on file  . Years of education: Not on file  . Highest education level: Not on file  Occupational History  . Not on file  Tobacco Use  . Smoking status: Never Smoker  . Smokeless tobacco: Never Used  Vaping Use  . Vaping Use: Never used  Substance and Sexual Activity  . Alcohol use: Never  . Drug use: Never  . Sexual activity: Not on file  Other Topics Concern  . Not on file  Social History Narrative  . Not on file   Social Determinants of Health   Financial Resource Strain: Not on file  Food Insecurity: Not on file  Transportation Needs: Not on file  Physical Activity: Not on file  Stress: Not on file  Social Connections: Not on file   Family History  Problem Relation Age of Onset  . Thyroid disease Mother   . Heart block Mother   . Kidney disease Father    Outpatient Encounter Medications as of 10/28/2020  Medication Sig  . aspirin 81 MG EC tablet Take 81 mg by mouth daily. Swallow whole.  . Multiple Vitamin (MULTIVITAMIN ADULT) TABS Take 1 tablet by mouth daily in the afternoon.  . [DISCONTINUED] atenolol (TENORMIN) 25 MG tablet Take 1 tablet (25 mg total) by mouth daily. (Patient not taking: Reported on 10/28/2020)   No facility-administered encounter medications on file as of 10/28/2020.   ALLERGIES: Allergies  Allergen Reactions  .  Hydrocodone-Acetaminophen Nausea And Vomiting    VACCINATION STATUS:  There is no immunization history on file for this patient.  HPI Bianca HohCherry Washington is 65 y.o. 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 PCP:  Kirt BoysMayo, Carmen Christina, PA-C.    See notes from her prior visits. History is obtained directly from the patient and review of her referral package.  She denies any prior history of thyroid dysfunction.  She was however found to have multinodular goiter based on ultrasound performed in September 2021-summarized below.  Admittedly, patient has been taking thyroid hormone supplement from [facility] for at least the last 7728-month.  In August 2021, her TSH was suppressed.  Her thyroid uptake and scan was low at 0.8% ruling out primary hypothyroidism.  With a diagnosis of surreptitious thyrotoxicosis, she was advised to discontinue all thyroid supplements which she says she did.  Her previsit labs still show significant improvement, however still has mild  Hyperthyroxinemia. Patient reports clinical improvement in her previous symptoms,  denies palpitations, tremors, nor heat intolerance. She still complains of hair loss.   She gives family history of thyroid dysfunction in one of her siblings.  She denies any prior history of thyroid surgery nor radioactive iodine thyroid ablation.  Due to significant nodular findings in large multinodular goiter, she was considered for thyroid ultrasound which revealed suspicious nodules.  She underwent fine-needle aspirations of 2 largest nodules on bilateral thyroid lobes.  The biopsy results from the right sided 3.8 cm nodule was significant for atypia of undetermined significance.  A sample was sent out for molecular study which returns approximately 4% risk of malignancy, essentially benign finding. She denies any exposure to neck  radiation.  She has gained about 3 pounds since last visit.  Prior to her last visit, she was having weight loss unintended.  Review of Systems  Constitutional: + Fluctuating body weight, + fatigue, no subjective hyperthermia, no subjective hypothermia   Objective:    Vitals with BMI 10/28/2020 10/07/2020 10/07/2020  Height  - -  Weight 227 lbs 6 oz - -  BMI 34.58 - -  Systolic 120 143 161  Diastolic 78 88 92  Pulse 72 74 73    BP 120/78   Pulse 72   Ht  (1.727 m)   Wt 227 lb 6.4 oz (103.1 kg)   BMI 34.58 kg/m   Wt Readings from Last 3 Encounters:  10/28/20 227 lb 6.4 oz (103.1 kg)  07/29/20 222 lb 6.4 oz (100.9 kg)  06/23/20 224 lb (101.6 kg)    Physical Exam  Constitutional:  Body mass index is 34.58 kg/m.,  not in acute distress, normal state of mind Eyes: PERRLA, EOMI, no exophthalmos ENT: moist mucous membranes, +gross thyromegaly, no gross cervical lymphadenopathy   CMP ( most recent) CMP     Component Value Date/Time   BUN 17 03/07/2020 0000   CREATININE 0.8 03/07/2020 0000   GFRNONAA 76 03/07/2020 0000   GFRAA 93 03/07/2020 0000     Diabetic Labs (most recent): Lab Results  Component Value Date   HGBA1C 5.5 03/07/2020    Lab Results  Component Value Date   TSH 0.174 (L) 09/23/2020   TSH 0.023 (L) 07/22/2020   TSH 0.048 (L) 06/09/2020   TSH 0.51 03/07/2020   FREET4 1.18 (H) 09/23/2020   FREET4 2.37 (H) 07/22/2020   FREET4 2.61 (H) 06/09/2020    March 19, 2020 thyroid ultrasound: Right lobe measures 8 x 3.5 x 5.6 cm with 1.4 cm nodule on the lower pole described as hypoechoic. Left lobe measures 4.3 x 1.8 x 1.8 cm with 1.2 cm isoechoic left lower pole nodule.   Thyroid uptake and scan on June 16, 2020 FINDINGS: Insufficient uptake of tracer within thyroid tissue for assessment of morphology.  4 hour I-123 uptake = 1.0% (normal 5-20%),  24 hour I-123 uptake = 0.8% (normal 10-30%)  IMPRESSION: Markedly decreased 4 hour  and 24 hour radio iodine uptakes. Insufficient uptake of tracer to assess thyroid morphology.   Surveillance thyroid ultrasound on September 23, 2020 showed  IMPRESSION: 1. Nodule 1 (TI-RADS 4) located in the inferior right thyroid lobe, measuring 2.0 x 1.5 x 1.4 cm, meets criteria for FNA. 2. Nodule 2 (TI-RADS 4) located in the inferior right thyroid lobe, measuring 3.8 x 3.0 x 3.6 cm, meets criteria for FNA. 3. Nodule 3 (TI-RADS 4) located in the superior right thyroid lobe, measuring 1.5 x 1.0 x 1.2 cm, meets criteria for FNA. 4. Nodule 4 (TI-RADS  4 located in the mid left thyroid lobe, measuring 1.6 x 1.2 x 1.2 cm, meets criteria for FNA. 5. Nodules 1 and 2 are more suspicious and should be considered for FNA before nodules 3 and 4.  October 07, 2020 FNA biopsy of bilateral thyroid nodules: Left lobe-benign 3.8 cm right lobe nodule-atypia of undetermined significance.  Subsequent molecular study: ~4% risk of malignancy-reported to be benign     Recent Results (from the past 2160 hour(s))  TSH     Status: Abnormal   Collection Time: 09/23/20  3:56 PM  Result Value Ref Range   TSH 0.174 (L) 0.350 - 4.500 uIU/mL    Comment: Performed by a 3rd Generation assay with a functional sensitivity of <=0.01 uIU/mL. Performed at [facility], [address], [place], [place] [address]   T4, free     Status: Abnormal   Collection Time: 09/23/20  3:56 PM  Result Value Ref Range   Free T4 1.18 (H) 0.61 - 1.12 ng/dL    Comment: (NOTE) Biotin ingestion may interfere with free T4 tests. If the results are inconsistent with the TSH level, previous test results, or the clinical presentation, then consider biotin interference. If needed, order repeat testing after stopping biotin. Performed at [facility], [address] [place], [place] [address]   T3, free     Status: None   Collection Time: 09/23/20  3:56 PM  Result Value Ref Range   T3, Free 3.3 2.0 - 4.4 pg/mL    Comment:  (NOTE) Performed At: [facility] [address] [place], [place] [id number] Jolene Schimke MD HQ:[id number]   Cytology - Non PAP;     Status: None   Collection Time: 10/07/20 10:05 AM  Result Value Ref Range   CYTOLOGY - NON GYN      CYTOLOGY - NON PAP CASE: [id number] PATIENT: Kylen Maselli Non-Gynecological Cytology Report     Clinical History: 3.8 cm RLL Specimen Submitted:  A. THYROID GLAND, RIGHT LOBE, FINE NEEDLE ASPIRATION:   FINAL MICROSCOPIC DIAGNOSIS: - Atypia of undetermined significance (Bethesda category III)  SPECIMEN ADEQUACY: Satisfactory for evaluation  DIAGNOSTIC COMMENTS: This specimen will be sent for Afirma testing.  GROSS: Received is/are 3 slides in 95% Ethyl alcohol, 3 air dried slides for diff stain and 30 cc's of pink Cytolyt solution (CM:cm) Prepared: Smears: 6 Concentration Method (Thin Prep): 1 Cell Block: Attempted, not obtained Additional Studies: Afirma collected     Final Diagnosis performed by Consuello Bossier, MD.   Electronically signed 10/08/2020 Technical component performed at [facility], [address], [place], [place] [address].  Professional component performed at [employer], [address] [place], [place] [address].  Immunohistochemistry Technical component (if applicable) was performed at [facility]. [address], [place], [place] [address].   IMMUNOHISTOCHEMISTRY DISCLAIMER (if applicable): Some of these immunohistochemical stains may have been developed and the performance characteristics determine by [facility]. Some may not have been cleared or approved by the U.S. Food and Drug Administration. The FDA has determined that such clearance or approval is not necessary. This test is used for clinical purposes. It should not be regarded as investigational or for research. This laboratory is certified under the Clinical  Laboratory Improvement Amendments of 1988 (CLIA-88) as qualified to perform high complexity clinical laboratory testing.  The controls stained appropriately.   Cytology - Non PAP;     Status: None   Collection Time: 10/07/20  10:05 AM  Result Value Ref Range   CYTOLOGY - NON GYN      Cytology - Non PAP CASE: [id number] PATIENT: Keishla Delaguila Non-Gynecological Cytology Report     Clinical History: 1.6 cm LML Specimen Submitted:  A. THYROID GLAND, LEFT LOBE, FINE NEEDLE ASPIRATION:   FINAL MICROSCOPIC DIAGNOSIS: - Consistent with benign follicular nodule (Bethesda category II)  SPECIMEN ADEQUACY: Satisfactory for evaluation  GROSS: Received is/are 3 slides in 95% Ethyl alcohol, 3 air dried slides for diff stain and 30 cc's of pink Cytolyt solution (CM:cm) Prepared: Smears: 6 Concentration Method (Thin Prep): 1 Cell Block: Attempted, not obtained Additional Studies: Afirma collected.     Final Diagnosis performed by Consuello Bossier, MD.   Electronically signed 10/08/2020 Technical component performed at [facility], [address], [place], [place] [address].  Professional component performed at [employer]. [facility], [address], [place], [place] [address].  Immunohistochemistry Technical component  (if applicable) was performed at [facility]. [address], [place], [place] [address].   IMMUNOHISTOCHEMISTRY DISCLAIMER (if applicable): Some of these immunohistochemical stains may have been developed and the performance characteristics determine by [facility]. Some may not have been cleared or approved by the U.S. Food and Drug Administration. The FDA has determined that such clearance or approval is not necessary. This test is used for clinical purposes. It should not be regarded as investigational or for research. This laboratory is certified under the Clinical Laboratory Improvement  Amendments of 1988 (CLIA-88) as qualified to perform high complexity clinical laboratory testing.  The controls stained appropriately.    Assessment & Plan:   1. Multinodular goiter 2.  Thyrotoxicosis  factitia  Her previsit work-up shows significant decrease in the thyroid hormone burden by about 50%.  She reports that she has stopped her thyroid supplements that she used to get from the [facility] shop[facility].  Her previous work-up including with thyroid uptake and scan was consistent with  surreptitious thyrotoxicosis. In light of her continued improvement in her clinical presentation as well as biochemical profile will not need antithyroid intervention at this time.  More specifically, she is advised to avoid any prothyroid supplements.  -Now that she is close to euthyroid state with no tachycardia, she would not need beta-blocker.  She is advised to stop atenolol. She will have repeat thyroid function test with office visit in 61-month. If she continues to have significant hyperthyroidism, her uptake and scan will be repeated.    In light of her previously documented multinodular goiter, since last visit, she underwent thyroid ultrasound which revealed significant multinodular goiter.  Most of the nodules were suspicious, she underwent fine-needle aspiration of 2 of the largest nodules on both lobes.  The right sided nodule revealed atypia of undetermined significance, subsequent Afirma molecular study rules out malignancy. She will not need surgery at this time.  She does not report compressive neck symptoms. She may need repeat thyroid ultrasound in 1-2 years to monitor.  Regarding her continued concern of hair loss: It is not related to her thyroid function or dysfunction. She is interested to see if she has any other hormone excess.  She will have fasting testosterone levels measurement.  It is unlikely that she has hyperandrogenism.  However patient would like to exhaust any potential  endocrine problems causing hair loss.  Her hair loss is not typical female pattern baldness.  - she is advised to maintain close follow up with Mayo, Baxter Kail,  PA-C for primary care needs.    I spent 45 minutes in the care of the patient today including review of labs from Thyroid Function, and other relevant labs ; imaging/biopsy records (current and previous including abstractions from other facilities); face-to-face time discussing  her biopsy results, lab results and symptoms, need for medication , her options of short and long term treatment based on the latest standards of care / guidelines;   and documenting the encounter.  Bianca Hoh  participated in the discussions, expressed understanding, and voiced agreement with the above plans.  All questions were answered to her satisfaction. she is encouraged to contact clinic should she have any questions or concerns prior to her return visit.    Follow up plan: Return in about 4 months (around 02/27/2021), or we will call her if testos is abnormal, for F/U with Pre-visit Labs, Fasting Labs  in AM B4 8.   Marquis Lunch, MD [facility] [address] [place], [place] [address] Phone: [phone]  Fax: [phone]     10/28/2020, 5:40 PM  This note was partially dictated with voice recognition software. Similar sounding words can be transcribed inadequately or may not  be corrected upon review.

## 2020-10-28 NOTE — Progress Notes (Signed)
Pt states she's experiencing difficulty sleeping, depression and worsening hair loss.

## 2020-11-03 LAB — TESTOSTERONE, FREE, TOTAL, SHBG
Sex Hormone Binding: 69.3 nmol/L (ref 17.3–125.0)
Testosterone, Free: 0.9 pg/mL (ref 0.0–4.2)
Testosterone: 17 ng/dL (ref 3–67)

## 2021-02-27 LAB — T4, FREE: Free T4: 1.38 ng/dL (ref 0.82–1.77)

## 2021-02-27 LAB — T3, FREE: T3, Free: 3.1 pg/mL (ref 2.0–4.4)

## 2021-02-27 LAB — TSH: TSH: 1.19 u[IU]/mL (ref 0.450–4.500)

## 2021-03-04 ENCOUNTER — Other Ambulatory Visit: Payer: Self-pay

## 2021-03-04 ENCOUNTER — Ambulatory Visit (INDEPENDENT_AMBULATORY_CARE_PROVIDER_SITE_OTHER): Payer: Medicare Other | Admitting: "Endocrinology

## 2021-03-04 ENCOUNTER — Encounter: Payer: Self-pay | Admitting: "Endocrinology

## 2021-03-04 VITALS — BP 104/70 | HR 80 | Ht 68.0 in | Wt 211.0 lb

## 2021-03-04 DIAGNOSIS — E042 Nontoxic multinodular goiter: Secondary | ICD-10-CM | POA: Diagnosis not present

## 2021-03-04 DIAGNOSIS — E0540 Thyrotoxicosis factitia without thyrotoxic crisis or storm: Secondary | ICD-10-CM | POA: Diagnosis not present

## 2021-03-04 NOTE — Progress Notes (Signed)
Pt's experiencing worsening difficulty sleeping, hair loss x 3 months

## 2021-03-04 NOTE — Progress Notes (Signed)
03/04/2021, 12:35 PM  Endocrinology follow-up note   Subjective:    Patient ID: Bianca Washington, female    DOB: [birth year], PCP Mayo, Baxter Kail, PA-C   Past Medical History:  Diagnosis Date   Dysuria    Fatigue    Hypercholesterolemia    Thyroid nodule    Vitamin D deficiency    Past Surgical History:  Procedure Laterality Date   CESAREAN SECTION     MEDIAL PARTIAL KNEE REPLACEMENT     REPLACEMENT TOTAL KNEE     Social History   Socioeconomic History   Marital status: Unknown    Spouse name: Not on file   Number of children: Not on file   Years of education: Not on file   Highest education level: Not on file  Occupational History   Not on file  Tobacco Use   Smoking status: Never   Smokeless tobacco: Never  Vaping Use   Vaping Use: Never used  Substance and Sexual Activity   Alcohol use: Never   Drug use: Never   Sexual activity: Not on file  Other Topics Concern   Not on file  Social History Narrative   Not on file   Social Determinants of Health   Financial Resource Strain: Not on file  Food Insecurity: Not on file  Transportation Needs: Not on file  Physical Activity: Not on file  Stress: Not on file  Social Connections: Not on file   Family History  Problem Relation Age of Onset   Thyroid disease Mother    Heart block Mother    Kidney disease Father    Outpatient Encounter Medications as of 03/04/2021  Medication Sig   PHENTERMINE HCL PO Take 37.5 mg by mouth daily. Take 1/2 tablet daily   aspirin 81 MG EC tablet Take 81 mg by mouth daily. Swallow whole.   Multiple Vitamin (MULTIVITAMIN ADULT) TABS Take 1 tablet by mouth daily in the afternoon.   No facility-administered encounter medications on file as of 03/04/2021.   ALLERGIES: Allergies  Allergen Reactions   Hydrocodone-Acetaminophen Nausea And Vomiting    VACCINATION STATUS:  There is no immunization history on file for this  patient.  HPI Bianca Washington is 65 y.o. 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 PCP:  Kirt Boys, PA-C.    See notes from her prior visits. History is obtained directly from the patient and review of her referral package.  She denies any prior history of thyroid dysfunction.  She was however found to have multinodular goiter based on ultrasound performed in September 2021-summarized below.  Admittedly, patient has been taking thyroid hormone supplement from [employer] for at least the last 35-month.  In August 2021, her TSH was suppressed.  Her thyroid uptake and scan was low at 0.8% ruling out primary hypothyroidism.  With a diagnosis of surreptitious thyrotoxicosis, she was advised to discontinue all thyroid supplements which she says she did.   Her previsit thyroid function test this time are consistent with euthyroid state. Patient reports clinical improvement in her previous symptoms, denies palpitations, tremors, nor heat intolerance. However, patient continues to  complain of hair loss, difficulty sleeping, and admits that she may be dealing with depression. However, she would not consider seeking consult with mental health.  She gives family history of thyroid dysfunction in one of her siblings.  She denies any prior history of thyroid surgery nor radioactive iodine thyroid ablation.  Due to significant nodular findings in large multinodular goiter, she was considered for thyroid ultrasound which revealed suspicious nodules.  She underwent fine-needle aspirations of 2 largest nodules on bilateral thyroid lobes.  The biopsy results from the right sided 3.8 cm nodule was significant for atypia of undetermined significance.  A sample was sent out for molecular study which returns approximately 4% risk of malignancy, essentially benign finding. She denies any  exposure to neck radiation.  She has lost about 15 pounds since last visit.  She denies dysphagia, shortness of breath, nor voice change.    Review of Systems  Constitutional: + Fluctuating body weight, + fatigue, no subjective hyperthermia, no subjective hypothermia   Objective:    Vitals with BMI 03/04/2021 10/28/2020 10/07/2020  Height 5\' 8"  5\' 8"  -  Weight 211 lbs 227 lbs 6 oz -  BMI 32.09 34.58 -  Systolic 104 120  Diastolic 70 78 88  Pulse 80 72 74    BP 104/70   Pulse 80   Ht 5\' 8"  (1.727 m)   Wt 211 lb (95.7 kg)   BMI 32.08 kg/m   Wt Readings from Last 3 Encounters:  03/04/21 211 lb (95.7 kg)  10/28/20 227 lb 6.4 oz (103.1 kg)  07/29/20 222 lb 6.4 oz (100.9 kg)    Physical Exam  Constitutional:  Body mass index is 32.08 kg/m.,  not in acute distress, normal state of mind Eyes: PERRLA, EOMI, no exophthalmos ENT: moist mucous membranes, +gross thyromegaly, no gross cervical lymphadenopathy   CMP ( most recent) CMP     Component Value Date/Time   BUN 17 03/07/2020 0000   CREATININE 0.8 03/07/2020 0000   GFRNONAA 76 03/07/2020 0000   GFRAA 93 03/07/2020 0000     Diabetic Labs (most recent): Lab Results  Component Value Date   HGBA1C 5.5 03/07/2020    Lab Results  Component Value Date   TSH 1.190 02/26/2021   TSH 0.174 (L) 09/23/2020   TSH 0.023 (L) 07/22/2020   TSH 0.048 (L) 06/09/2020   TSH 0.51 03/07/2020   FREET4 1.38 02/26/2021   FREET4 1.18 (H) 09/23/2020   FREET4 2.37 (H) 07/22/2020   FREET4 2.61 (H) 06/09/2020    March 19, 2020 thyroid ultrasound: Right lobe measures 8 x 3.5 x 5.6 cm with 1.4 cm nodule on the lower pole described as hypoechoic. Left lobe measures 4.3 x 1.8 x 1.8 cm with 1.2 cm isoechoic left lower pole nodule.   Thyroid uptake and scan on June 16, 2020 FINDINGS: Insufficient uptake of tracer within thyroid tissue for assessment of morphology.   4 hour I-123 uptake = 1.0% (normal 5-20%),  24 hour I-123  uptake = 0.8% (normal 10-30%)   IMPRESSION: Markedly decreased 4 hour and 24 hour radio iodine uptakes. Insufficient uptake of tracer to assess thyroid morphology.   Surveillance thyroid ultrasound on September 23, 2020 showed   IMPRESSION: 1. Nodule 1 (TI-RADS 4) located in the inferior right thyroid lobe, measuring 2.0 x 1.5 x 1.4 cm, meets criteria for FNA. 2. Nodule 2 (TI-RADS 4) located in the inferior right thyroid lobe, measuring 3.8 x 3.0 x 3.6 cm, meets criteria for FNA. 3. Nodule  3 (TI-RADS 4) located in the superior right thyroid lobe, measuring 1.5 x 1.0 x 1.2 cm, meets criteria for FNA. 4. Nodule 4 (TI-RADS 4 located in the mid left thyroid lobe, measuring 1.6 x 1.2 x 1.2 cm, meets criteria for FNA. 5. Nodules 1 and 2 are more suspicious and should be considered for FNA before nodules 3 and 4.  October 07, 2020 FNA biopsy of bilateral thyroid nodules: Left lobe-benign 3.8 cm right lobe nodule-atypia of undetermined significance.  Subsequent molecular study: ~4% risk of malignancy-reported to be benign     Recent Results (from the past 2160 hour(s))  TSH     Status: None   Collection Time: 02/26/21  1:12 PM  Result Value Ref Range   TSH 1.190 0.450 - 4.500 uIU/mL  T4, free     Status: None   Collection Time: 02/26/21  1:12 PM  Result Value Ref Range   Free T4 1.38 0.82 - 1.77 ng/dL  T3, free     Status: None   Collection Time: 02/26/21  1:12 PM  Result Value Ref Range   T3, Free 3.1 2.0 - 4.4 pg/mL   Assessment & Plan:   1. Multinodular goiter 2.  Thyrotoxicosis  factitia  Her previsit work-up shows reversal of her thyroid function test from previously documented surreptitious thyrotoxicosis.    Her labs are consistent with euthyroid presentation.  She will not need any antithyroid intervention at this time. See notes from previous visits.  More specifically, she is advised to avoid any prothyroid supplements. Regarding her possible depression, she was  offered referral to mental health.  She declined this option.  She reports that she will see a new provider in [facility] for primary care and she will update me if she needs any further referral arrangements.  She wishes to avoid any antidepressant medications.  In light of her multinodular goiter, she is status post biopsy of multiple nodules.   The right sided nodule revealed atypia of undetermined significance, subsequent Afirma molecular study rules out malignancy. She will not need surgery at this time.  She does not report compressive neck symptoms.  The right lobe of her thyroid was 9 cm and left lobe was 5.7 cm. She is approached for repeat thyroid ultrasound before her next visit in 6 months.     - she is advised to maintain close follow up with Mayo, Baxter Kail, PA-C for primary care needs.   I spent 25 minutes in the care of the patient today including review of labs from Thyroid Function, CMP, and other relevant labs ; imaging/biopsy records (current and previous including abstractions from other facilities); face-to-face time discussing  her lab results and symptoms, medications doses, her options of short and long term treatment based on the latest standards of care / guidelines;   and documenting the encounter.  Bianca Washington  participated in the discussions, expressed understanding, and voiced agreement with the above plans.  All questions were answered to her satisfaction. she is encouraged to contact clinic should she have any questions or concerns prior to her return visit.     Follow up plan: Return in about 6 months (around 09/04/2021) for F/U with Pre-visit Labs, Thyroid / Neck Ultrasound.   Marquis Lunch, MD [facility] [address] [place], [place] [address] Phone: [phone]  Fax: [phone]     03/04/2021, 12:35 PM  This note was partially dictated with voice recognition software. Similar sounding  words can be  transcribed inadequately or may not  be corrected upon review.

## 2021-09-02 ENCOUNTER — Ambulatory Visit (HOSPITAL_COMMUNITY): Payer: Medicare Other

## 2021-09-09 ENCOUNTER — Ambulatory Visit: Payer: Medicare Other | Admitting: "Endocrinology

## 2021-10-22 ENCOUNTER — Emergency Department (INDEPENDENT_AMBULATORY_CARE_PROVIDER_SITE_OTHER): Payer: Medicare Other

## 2021-10-22 ENCOUNTER — Encounter: Payer: Self-pay | Admitting: Emergency Medicine

## 2021-10-22 ENCOUNTER — Emergency Department (INDEPENDENT_AMBULATORY_CARE_PROVIDER_SITE_OTHER)
Admission: EM | Admit: 2021-10-22 | Discharge: 2021-10-22 | Disposition: A | Discharge status: Home / Self Care | Payer: Medicare Other | Source: Home / Self Care

## 2021-10-22 ENCOUNTER — Other Ambulatory Visit: Payer: Self-pay

## 2021-10-22 DIAGNOSIS — J309 Allergic rhinitis, unspecified: Secondary | ICD-10-CM | POA: Diagnosis not present

## 2021-10-22 DIAGNOSIS — R059 Cough, unspecified: Secondary | ICD-10-CM

## 2021-10-22 DIAGNOSIS — J0100 Acute maxillary sinusitis, unspecified: Secondary | ICD-10-CM

## 2021-10-22 MED ORDER — FEXOFENADINE HCL 180 MG PO TABS: 180.0000 mg | ORAL_TABLET | Freq: Every day | ORAL | 0 refills | Status: AC

## 2021-10-22 MED ORDER — BENZONATATE 200 MG PO CAPS: 200.0000 mg | ORAL_CAPSULE | Freq: Three times a day (TID) | ORAL | 0 refills | Status: AC | PRN

## 2021-10-22 MED ORDER — AMOXICILLIN-POT CLAVULANATE 875-125 MG PO TABS: 1.0000 | ORAL_TABLET | Freq: Two times a day (BID) | ORAL | 0 refills | Status: AC

## 2021-10-22 NOTE — ED Provider Notes (Signed)
?[facility] ? ? ? ?CSN: [id number] ?Arrival date & time: 10/22/21  1604 ? ? ?  ? ?History   ?Chief Complaint ?Chief Complaint  ?Patient presents with  ? Cough  ? ? ?HPI ?Bianca Washington is a 66 y.o. female.  ? ?HPI 66 year old female presents with productive cough for 2-1/2 weeks.  Patient reports history of pneumonia.  PMH significant for multinodular goiter and vitamin D deficiency. ? ?Past Medical History:  ?Diagnosis Date  ? Dysuria   ? Fatigue   ? Hypercholesterolemia   ? Thyroid nodule   ? Vitamin D deficiency   ? ? ?Patient Active Problem List  ? Diagnosis Date Noted  ? Hair loss 10/28/2020  ? 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 Multinodular goiter 05/05/2020  ? ? ?Past Surgical History:  ?Procedure Laterality Date  ? CESAREAN SECTION    ? MEDIAL PARTIAL KNEE REPLACEMENT    ? REPLACEMENT TOTAL KNEE    ? ? ?OB History   ?No obstetric history on file. ?  ? ? ? ?Home Medications   ? ?Prior to Admission medications   ?Medication Sig Start Date End Date Taking? Authorizing Provider  ?amoxicillin-clavulanate (AUGMENTIN) 875-125 MG tablet Take 1 tablet by mouth 2 (two) times daily for 10 days. 10/22/21 11/01/21 Yes Trevor Iha, FNP  ?benzonatate (TESSALON) 200 MG capsule Take 1 capsule (200 mg total) by mouth 3 (three) times daily as needed for up to 7 days for cough. 10/22/21 10/29/21 Yes Trevor Iha, FNP  ?fexofenadine (ALLEGRA ALLERGY) 180 MG tablet Take 1 tablet (180 mg total) by mouth daily for 15 days. 10/22/21 11/06/21 Yes Trevor Iha, FNP  ?Multiple Vitamin (MULTIVITAMIN ADULT) TABS Take 1 tablet by mouth daily in the afternoon.    [provider]  ? ? ?Family History ?Family History  ?Problem Relation Age of Onset  ? Thyroid disease Mother   ? Heart block Mother   ? Kidney disease Father   ? ? ?Social History ?Social History  ? ?Tobacco Use  ? Smoking status: Never  ? Smokeless tobacco: Never  ?Vaping Use  ? Vaping Use: Never used  ?Substance Use Topics  ? Alcohol use:  Never  ? Drug use: Never  ? ? ? ?Allergies   ?Hydrocodone-acetaminophen ? ? ?Review of Systems ?Review of Systems  ?Respiratory:  Positive for cough.   ?All other systems reviewed and are negative. ? ? ?Physical Exam ?Triage Vital Signs ?ED Triage Vitals  ?Enc Vitals Group  ?   BP   ?   Pulse   ?   Resp   ?   Temp   ?   Temp src   ?   SpO2   ?   Weight   ?   Height   ?   Head Circumference   ?   Peak Flow   ?   Pain Score   ?   Pain Loc   ?   Pain Edu?   ?   Excl. in GC?   ? ?No data found. ? ?Updated Vital Signs ?BP 138/85 (BP Location: Left Arm)   Pulse 85   Temp 98.6 ?F (37 ?C) (Oral)   Resp 16   Ht 5\' 8"  (1.727 m)   Wt 212 lb (96.2 kg)   SpO2 100%   BMI 32.23 kg/m?  ? ? ? ?Physical Exam ?Vitals and nursing note reviewed.  ?Constitutional:   ?   Appearance: Normal appearance. She is obese.  ?HENT:  ?  Head: Normocephalic and atraumatic.  ?   Right Ear: Tympanic membrane and external ear normal.  ?   Left Ear: Tympanic membrane and external ear normal.  ?   Ears:  ?   Comments: Moderate eustachian tube dysfunction noted bilaterally ?   Nose: Nose normal.  ?   Comments: Turbinates are erythematous/edematous ?   Mouth/Throat:  ?   Mouth: Mucous membranes are moist.  ?   Pharynx: Oropharynx is clear.  ?   Comments: Moderate to significant amount of clear drainage of posterior oropharynx noted ?Eyes:  ?   Extraocular Movements: Extraocular movements intact.  ?   Conjunctiva/sclera: Conjunctivae normal.  ?   Pupils: Pupils are equal, round, and reactive to light.  ?Cardiovascular:  ?   Rate and Rhythm: Normal rate and regular rhythm.  ?   Pulses: Normal pulses.  ?   Heart sounds: Normal heart sounds.  ?Pulmonary:  ?   Effort: Pulmonary effort is normal.  ?   Breath sounds: Normal breath sounds. No wheezing or rhonchi.  ?Musculoskeletal:  ?   Cervical back: Normal range of motion and neck supple.  ?Skin: ?   General: Skin is warm and dry.  ?Neurological:  ?   General: No focal deficit present.  ?   Mental  Status: She is alert and oriented to person, place, and time.  ? ? ? ?UC Treatments / Results  ?Labs ?(all labs ordered are listed, but only abnormal results are displayed) ?Labs Reviewed - No data to display ? ?EKG ? ? ?Radiology ?DG Chest 2 View ? ?Result Date: 10/22/2021 ?CLINICAL DATA:  cough EXAM: CHEST - 2 VIEW COMPARISON:  None. FINDINGS: The heart and mediastinal contours are within normal limits. Aortic calcification. No focal consolidation. No pulmonary edema. No pleural effusion. No pneumothorax. No acute osseous abnormality. IMPRESSION: 1. No active cardiopulmonary disease. 2.  Aortic Atherosclerosis (ICD10-I70.0). Electronically Signed   By: Tish Frederickson M.D.   On: 10/22/2021 16:56   ? ?Procedures ?Procedures (including critical care time) ? ?Medications Ordered in UC ?Medications - No data to display ? ?Initial Impression / Assessment and Plan / UC Course  ?I have reviewed the triage vital signs and the nursing notes. ? ?Pertinent labs & imaging results that were available during my care of the patient were reviewed by me and considered in my medical decision making (see chart for details). ? ?  ? ?MDM: 1.  Acute maxillary sinusitis-Rx'd Augmentin; 2.  Allergic rhinitis-Rx'd Allegra; 3.  Cough-Rx'd Tessalon Perles. Advised patient of chest x-ray results with hard copy provided to patient.  No acute cardiopulmonary process identified.  Advised patient to take medication as directed with food to completion.  Advised patient to take Allegra with first dose of Augmentin for the next 5 of 10 days.  Advised may use Tessalon Perles daily or as needed for cough.  Encourage patient increase daily water intake while taking these medications.  Advised patient if symptoms worsen and/or unresolved please follow-up with PCP or here for further evaluation.  Discharged home, hemodynamically stable. ?Final Clinical Impressions(s) / UC Diagnoses  ? ?Final diagnoses:  ?Cough, unspecified type  ?Acute maxillary  sinusitis, recurrence not specified  ?Allergic rhinitis, unspecified seasonality, unspecified trigger  ? ? ? ?Discharge Instructions   ? ?  ?Advised patient of chest x-ray results with hard copy provided to patient.  No acute cardiopulmonary process identified.  Advised patient to take medication as directed with food to completion.  Advised patient to take Allegra  with first dose of Augmentin for the next 5 of 10 days.  Advised may use Tessalon Perles daily or as needed for cough.  Encourage patient increase daily water intake while taking these medications.  Advised patient if symptoms worsen and/or unresolved please follow-up with PCP or here for further evaluation. ? ? ? ? ?ED Prescriptions   ? ? Medication Sig Dispense Auth. Provider  ? amoxicillin-clavulanate (AUGMENTIN) 875-125 MG tablet Take 1 tablet by mouth 2 (two) times daily for 10 days. 20 tablet Trevor Ihaagan, Coy Vandoren, FNP  ? fexofenadine (ALLEGRA ALLERGY) 180 MG tablet Take 1 tablet (180 mg total) by mouth daily for 15 days. 15 tablet Trevor Ihaagan, Elanor Cale, FNP  ? benzonatate (TESSALON) 200 MG capsule Take 1 capsule (200 mg total) by mouth 3 (three) times daily as needed for up to 7 days for cough. 40 capsule Trevor Ihaagan, Quinlan Vollmer, FNP  ? ?  ? ?PDMP not reviewed this encounter. ?  ?Trevor IhaRagan, Elzora Cullins, FNP ?10/22/21 1726 ? ?

## 2021-10-22 NOTE — ED Triage Notes (Signed)
Productive cough x 2.5 weeks ?Green mucus ?Hx pneumonia ?

## 2021-10-22 NOTE — Discharge Instructions (Addendum)
Advised patient of chest x-ray results with hard copy provided to patient.  No acute cardiopulmonary process identified.  Advised patient to take medication as directed with food to completion.  Advised patient to take Allegra with first dose of Augmentin for the next 5 of 10 days.  Advised may use Tessalon Perles daily or as needed for cough.  Encourage patient increase daily water intake while taking these medications.  Advised patient if symptoms worsen and/or unresolved please follow-up with PCP or here for further evaluation. ?

## 2021-12-09 ENCOUNTER — Ambulatory Visit (INDEPENDENT_AMBULATORY_CARE_PROVIDER_SITE_OTHER): Payer: Medicare Other | Admitting: Family Medicine

## 2021-12-09 ENCOUNTER — Encounter: Payer: Self-pay | Admitting: Family Medicine

## 2021-12-09 DIAGNOSIS — Z7689 Persons encountering health services in other specified circumstances: Secondary | ICD-10-CM | POA: Diagnosis not present

## 2021-12-09 DIAGNOSIS — N951 Menopausal and female climacteric states: Secondary | ICD-10-CM | POA: Diagnosis not present

## 2021-12-09 NOTE — Progress Notes (Signed)
New Patient Office Visit  Subjective    Patient ID: Bianca Washington, female    DOB: 10-12-1955  Age: 66 y.o. MRN: [id number]  CC:  Chief Complaint  Patient presents with   Establish Care    Would like iron checked, last set of labs were high but states it may have been due to Maca medication (has large amounts of iron in it). Has now stopped taking it so would like a recheck.     HPI Bianca Washington presents to establish care.   She is here requesting labs due to having elevated serum iron level and ferritin. She had labs done at [facility] on 10/25/2021 and brought in her results. Iron level 215, ferritin level 256 with normal hemoglobin 15.5 and hematocrit 47.4% normal liver function tests and renal function as well.  Thyroid function normal.  Reports taking oral iron supplement prior to having her labs done at [facility] in April.  States she was taking MACA.  Which she found out contained a high amount of iron.  States she stopped taking the supplement when she found out about her labs. States she started taking Maca on her own because her endocrinologist took her off of her hormones and she was feeling depressed and having hot flashes.   [facility] is prescribing progesterone 100 mg for her vasomotor symptoms and she reports they have improved.  States she is sleeping better and her mood is better since being on progesterone. States [facility] is also prescribing diethylpropion HCL CR 75 mg for weight loss.  Allegra for allergies PRN   Denies fever, chills, dizziness, chest pain, palpitations, shortness of breath, abdominal pain, N/V/D.        Outpatient Encounter Medications as of 12/09/2021  Medication Sig   Diethylpropion HCl CR 75 MG TB24 Take 1 tablet by mouth every morning.   fexofenadine (ALLEGRA ALLERGY) 180 MG tablet Take 1 tablet (180 mg total) by mouth daily for 15 days.   Multiple Vitamin (MULTIVITAMIN ADULT) TABS Take 1 tablet by mouth daily in the afternoon.   Omega-3 Fatty  Acids (FISH OIL) 1000 MG CAPS Take by mouth.   progesterone (PROMETRIUM) 100 MG capsule Take 100 mg by mouth at bedtime.   Ascorbic Acid 500 MG CHEW Chew by mouth. (Patient not taking: Reported on 12/09/2021)   aspirin EC 81 MG tablet Take by mouth. (Patient not taking: Reported on 12/09/2021)   No facility-administered encounter medications on file as of 12/09/2021.    Past Medical History:  Diagnosis Date   Dysuria    Fatigue    Hypercholesterolemia    Thyroid nodule    Vitamin D deficiency     Past Surgical History:  Procedure Laterality Date   CESAREAN SECTION     MEDIAL PARTIAL KNEE REPLACEMENT     REPLACEMENT TOTAL KNEE      Family History  Problem Relation Age of Onset   Thyroid disease Mother    Heart block Mother    Kidney disease Father     Social History   Socioeconomic History   Marital status: Unknown    Spouse name: Not on file   Number of children: Not on file   Years of education: Not on file   Highest education level: Not on file  Occupational History   Not on file  Tobacco Use   Smoking status: Never   Smokeless tobacco: Never  Vaping Use   Vaping Use: Never used  Substance and Sexual Activity  Alcohol use: Never   Drug use: Never   Sexual activity: Not on file  Other Topics Concern   Not on file  Social History Narrative   Not on file   Social Determinants of Health   Financial Resource Strain: Not on file  Food Insecurity: Not on file  Transportation Needs: Not on file  Physical Activity: Not on file  Stress: Not on file  Social Connections: Not on file  Intimate Partner Violence: Not on file    ROS Pertinent positives and negatives in the history of present illness.      Objective    BP 136/88 (BP Location: Left Arm, Patient Position: Sitting, Cuff Size: Large)   Pulse 82   Temp 97.6 F (36.4 C) (Temporal)   Ht 5\' 8"  (1.727 m)   Wt 215 lb (97.5 kg)   SpO2 97%   BMI 32.69 kg/m  Alert and oriented and in no acute  distress.  Respirations unlabored.  Not otherwise examined. Physical Exam       Assessment & Plan:   Problem List Items Addressed This Visit   None Visit Diagnoses     Iron excess    -  Primary   Relevant Orders   CBC with Differential/Platelet   Iron, TIBC and Ferritin Panel   Vasomotor symptoms due to menopause       Encounter to establish care          She is new to the practice and here today to establish care.  She is requesting labs for recent iron excess.  She brought in lab results from [facility] who is managing her progesterone and weight loss medication. Reviewed labs with patient. Elevated iron and ferritin with normal hemoglobin.  Normal renal function, thyroid function and liver function. She reportedly was taking a supplement with high amount of iron and she stopped that supplement approximately 5 weeks ago. Follow-up pending lab results.  Return for pending labs.   Harland Dingwall, NP-C

## 2021-12-09 NOTE — Patient Instructions (Signed)
Please go to the first floor for your labs before you leave today.  We will be in touch with your lab results

## 2021-12-10 LAB — CBC WITH DIFFERENTIAL/PLATELET
Basophils Absolute: 0 10*3/uL (ref 0.0–0.1)
Basophils Relative: 0.4 % (ref 0.0–3.0)
Eosinophils Absolute: 0.1 10*3/uL (ref 0.0–0.7)
Eosinophils Relative: 1.5 % (ref 0.0–5.0)
HCT: 41.6 % (ref 36.0–46.0)
Hemoglobin: 13.8 g/dL (ref 12.0–15.0)
Lymphocytes Relative: 22.9 % (ref 12.0–46.0)
Lymphs Abs: 1.6 10*3/uL (ref 0.7–4.0)
MCHC: 33.3 g/dL (ref 30.0–36.0)
MCV: 95.1 fl (ref 78.0–100.0)
Monocytes Absolute: 0.5 10*3/uL (ref 0.1–1.0)
Monocytes Relative: 7.4 % (ref 3.0–12.0)
Neutro Abs: 4.8 10*3/uL (ref 1.4–7.7)
Neutrophils Relative %: 67.8 % (ref 43.0–77.0)
Platelets: 196 10*3/uL (ref 150.0–400.0)
RBC: 4.37 Mil/uL (ref 3.87–5.11)
RDW: 12.6 % (ref 11.5–15.5)
WBC: 7.1 10*3/uL (ref 4.0–10.5)

## 2021-12-10 LAB — IRON,TIBC AND FERRITIN PANEL
%SAT: 33 % (calc) (ref 16–45)
Ferritin: 200 ng/mL (ref 16–288)
Iron: 97 ug/dL (ref 45–160)
TIBC: 295 mcg/dL (calc) (ref 250–450)

## 2021-12-22 ENCOUNTER — Telehealth: Payer: Self-pay | Admitting: Family Medicine

## 2021-12-22 NOTE — Telephone Encounter (Signed)
Patient called concerning prior labs - still has not received results and would like to be called concerning her iron levl

## 2021-12-23 NOTE — Telephone Encounter (Signed)
ATC pt with results but mailbox was full so I was unable to leave message. Labs were printed and mailed to address on file on 6/1.

## 2022-05-10 ENCOUNTER — Other Ambulatory Visit: Payer: Self-pay | Admitting: Nurse Practitioner

## 2022-05-10 DIAGNOSIS — Z1239 Encounter for other screening for malignant neoplasm of breast: Secondary | ICD-10-CM

## 2022-07-22 ENCOUNTER — Ambulatory Visit: Payer: Medicare Other

## 2022-08-11 DIAGNOSIS — Z6831 Body mass index (BMI) 31.0-31.9, adult: Secondary | ICD-10-CM | POA: Diagnosis not present

## 2022-08-11 DIAGNOSIS — L659 Nonscarring hair loss, unspecified: Secondary | ICD-10-CM | POA: Diagnosis not present

## 2022-08-11 DIAGNOSIS — E7800 Pure hypercholesterolemia, unspecified: Secondary | ICD-10-CM | POA: Diagnosis not present

## 2022-08-11 DIAGNOSIS — N951 Menopausal and female climacteric states: Secondary | ICD-10-CM | POA: Diagnosis not present

## 2022-09-12 DIAGNOSIS — R7309 Other abnormal glucose: Secondary | ICD-10-CM | POA: Diagnosis not present

## 2022-09-12 DIAGNOSIS — E7800 Pure hypercholesterolemia, unspecified: Secondary | ICD-10-CM | POA: Diagnosis not present

## 2022-09-12 DIAGNOSIS — E038 Other specified hypothyroidism: Secondary | ICD-10-CM | POA: Diagnosis not present

## 2022-09-12 DIAGNOSIS — N951 Menopausal and female climacteric states: Secondary | ICD-10-CM | POA: Diagnosis not present

## 2022-09-15 DIAGNOSIS — G479 Sleep disorder, unspecified: Secondary | ICD-10-CM | POA: Diagnosis not present

## 2022-09-15 DIAGNOSIS — E038 Other specified hypothyroidism: Secondary | ICD-10-CM | POA: Diagnosis not present

## 2022-09-15 DIAGNOSIS — E7800 Pure hypercholesterolemia, unspecified: Secondary | ICD-10-CM | POA: Diagnosis not present

## 2022-09-15 DIAGNOSIS — M2550 Pain in unspecified joint: Secondary | ICD-10-CM | POA: Diagnosis not present

## 2022-10-18 DIAGNOSIS — L648 Other androgenic alopecia: Secondary | ICD-10-CM | POA: Diagnosis not present

## 2022-10-18 DIAGNOSIS — L650 Telogen effluvium: Secondary | ICD-10-CM | POA: Diagnosis not present

## 2022-10-24 DIAGNOSIS — E7800 Pure hypercholesterolemia, unspecified: Secondary | ICD-10-CM | POA: Diagnosis not present

## 2022-10-24 DIAGNOSIS — E669 Obesity, unspecified: Secondary | ICD-10-CM | POA: Diagnosis not present

## 2022-11-21 DIAGNOSIS — Z0000 Encounter for general adult medical examination without abnormal findings: Secondary | ICD-10-CM | POA: Diagnosis not present

## 2022-11-21 DIAGNOSIS — Z8673 Personal history of transient ischemic attack (TIA), and cerebral infarction without residual deficits: Secondary | ICD-10-CM | POA: Diagnosis not present

## 2022-11-21 DIAGNOSIS — E039 Hypothyroidism, unspecified: Secondary | ICD-10-CM | POA: Diagnosis not present

## 2022-12-01 DIAGNOSIS — E038 Other specified hypothyroidism: Secondary | ICD-10-CM | POA: Diagnosis not present

## 2022-12-01 DIAGNOSIS — N951 Menopausal and female climacteric states: Secondary | ICD-10-CM | POA: Diagnosis not present

## 2022-12-08 DIAGNOSIS — N951 Menopausal and female climacteric states: Secondary | ICD-10-CM | POA: Diagnosis not present

## 2022-12-08 DIAGNOSIS — E038 Other specified hypothyroidism: Secondary | ICD-10-CM | POA: Diagnosis not present

## 2022-12-08 DIAGNOSIS — G479 Sleep disorder, unspecified: Secondary | ICD-10-CM | POA: Diagnosis not present

## 2022-12-08 DIAGNOSIS — L659 Nonscarring hair loss, unspecified: Secondary | ICD-10-CM | POA: Diagnosis not present

## 2022-12-18 DIAGNOSIS — Z1211 Encounter for screening for malignant neoplasm of colon: Secondary | ICD-10-CM | POA: Diagnosis not present

## 2022-12-19 ENCOUNTER — Ambulatory Visit: Payer: Medicare Other

## 2023-01-04 ENCOUNTER — Ambulatory Visit
Admission: RE | Admit: 2023-01-04 | Discharge: 2023-01-04 | Disposition: A | Discharge status: Home / Self Care | Payer: Medicare Other | Source: Ambulatory Visit | Attending: Nurse Practitioner | Admitting: Nurse Practitioner

## 2023-01-04 DIAGNOSIS — Z1239 Encounter for other screening for malignant neoplasm of breast: Secondary | ICD-10-CM

## 2023-02-27 DIAGNOSIS — R5383 Other fatigue: Secondary | ICD-10-CM | POA: Diagnosis not present

## 2023-02-27 DIAGNOSIS — L659 Nonscarring hair loss, unspecified: Secondary | ICD-10-CM | POA: Diagnosis not present

## 2023-02-27 DIAGNOSIS — E7800 Pure hypercholesterolemia, unspecified: Secondary | ICD-10-CM | POA: Diagnosis not present

## 2023-02-27 DIAGNOSIS — E559 Vitamin D deficiency, unspecified: Secondary | ICD-10-CM | POA: Diagnosis not present

## 2023-04-25 DIAGNOSIS — L820 Inflamed seborrheic keratosis: Secondary | ICD-10-CM | POA: Diagnosis not present

## 2023-04-25 DIAGNOSIS — L648 Other androgenic alopecia: Secondary | ICD-10-CM | POA: Diagnosis not present

## 2023-04-25 DIAGNOSIS — L650 Telogen effluvium: Secondary | ICD-10-CM | POA: Diagnosis not present
